# Patient Record
Sex: Female | Born: 1955 | Race: White | Hispanic: No | Smoking: Current every day smoker
Health system: Southern US, Community
[De-identification: ages and names within clinical notes are randomized; demographics above are authoritative.]

## PROBLEM LIST (undated history)

## (undated) ENCOUNTER — Emergency Department (HOSPITAL_COMMUNITY): Payer: Self-pay | Source: Home / Self Care

## (undated) DIAGNOSIS — I779 Disorder of arteries and arterioles, unspecified: Secondary | ICD-10-CM

## (undated) DIAGNOSIS — M5430 Sciatica, unspecified side: Secondary | ICD-10-CM

## (undated) DIAGNOSIS — E119 Type 2 diabetes mellitus without complications: Secondary | ICD-10-CM

## (undated) DIAGNOSIS — G8929 Other chronic pain: Secondary | ICD-10-CM

## (undated) DIAGNOSIS — IMO0002 Reserved for concepts with insufficient information to code with codable children: Secondary | ICD-10-CM

## (undated) DIAGNOSIS — M549 Dorsalgia, unspecified: Secondary | ICD-10-CM

## (undated) DIAGNOSIS — IMO0001 Reserved for inherently not codable concepts without codable children: Secondary | ICD-10-CM

## (undated) DIAGNOSIS — E7800 Pure hypercholesterolemia, unspecified: Secondary | ICD-10-CM

## (undated) HISTORY — PX: CHOLECYSTECTOMY: SHX55

## (undated) HISTORY — PX: TONSILLECTOMY: SUR1361

## (undated) HISTORY — PX: ECTOPIC PREGNANCY SURGERY: SHX613

## (undated) HISTORY — PX: ABDOMINAL HYSTERECTOMY: SHX81

---

## 2005-08-25 ENCOUNTER — Encounter: Payer: Self-pay | Admitting: Family Medicine

## 2005-09-07 ENCOUNTER — Encounter: Payer: Self-pay | Admitting: Family Medicine

## 2005-10-07 ENCOUNTER — Encounter: Payer: Self-pay | Admitting: Family Medicine

## 2009-11-27 ENCOUNTER — Emergency Department: Payer: Self-pay | Admitting: Emergency Medicine

## 2009-12-05 ENCOUNTER — Emergency Department: Payer: Self-pay | Admitting: Emergency Medicine

## 2014-01-25 ENCOUNTER — Encounter (HOSPITAL_COMMUNITY): Payer: Self-pay | Admitting: Emergency Medicine

## 2014-01-25 ENCOUNTER — Emergency Department (HOSPITAL_COMMUNITY)
Admission: EM | Admit: 2014-01-25 | Discharge: 2014-01-26 | Disposition: A | Discharge status: Home / Self Care | Payer: Self-pay | Attending: Emergency Medicine | Admitting: Emergency Medicine

## 2014-01-25 ENCOUNTER — Emergency Department (HOSPITAL_COMMUNITY): Payer: Self-pay

## 2014-01-25 DIAGNOSIS — M5430 Sciatica, unspecified side: Secondary | ICD-10-CM | POA: Insufficient documentation

## 2014-01-25 DIAGNOSIS — M5432 Sciatica, left side: Secondary | ICD-10-CM

## 2014-01-25 DIAGNOSIS — Z79899 Other long term (current) drug therapy: Secondary | ICD-10-CM | POA: Insufficient documentation

## 2014-01-25 DIAGNOSIS — F17200 Nicotine dependence, unspecified, uncomplicated: Secondary | ICD-10-CM | POA: Insufficient documentation

## 2014-01-25 DIAGNOSIS — Z90710 Acquired absence of both cervix and uterus: Secondary | ICD-10-CM | POA: Insufficient documentation

## 2014-01-25 DIAGNOSIS — G8929 Other chronic pain: Secondary | ICD-10-CM | POA: Insufficient documentation

## 2014-01-25 DIAGNOSIS — M25559 Pain in unspecified hip: Secondary | ICD-10-CM | POA: Insufficient documentation

## 2014-01-25 DIAGNOSIS — Z7982 Long term (current) use of aspirin: Secondary | ICD-10-CM | POA: Insufficient documentation

## 2014-01-25 HISTORY — DX: Other chronic pain: G89.29

## 2014-01-25 HISTORY — DX: Reserved for concepts with insufficient information to code with codable children: IMO0002

## 2014-01-25 HISTORY — DX: Dorsalgia, unspecified: M54.9

## 2014-01-25 MED ORDER — IBUPROFEN 800 MG PO TABS
800.0000 mg | ORAL_TABLET | Freq: Once | ORAL | Status: AC
  Administered 2014-01-25: 800 mg via ORAL
  Filled 2014-01-25: qty 1

## 2014-01-25 MED ORDER — HYDROCODONE-ACETAMINOPHEN 5-325 MG PO TABS
2.0000 | ORAL_TABLET | Freq: Once | ORAL | Status: AC
  Administered 2014-01-25: 2 via ORAL
  Filled 2014-01-25: qty 2

## 2014-01-25 NOTE — ED Provider Notes (Signed)
CSN: [id number]     Arrival date & time 01/25/14  2127 History  This chart was scribed for Glynn Octave, MD by Bronson Curb, ED Scribe. This patient was seen in room APA11/APA11 and the patient's care was started at 11:32 PM.     Chief Complaint  Patient presents with  . Hip Pain     The history is provided by the patient. No language interpreter was used.    HPI Comments: Brianna Edwards is a 59 y.o. female who presents to the Emergency Department complaining of left hip pain onset today. Patient states she has experienced this pain before, but has gotten worse since raking the yard this evening. She states the pain radiates from her back down to her left knee. There is associated left lower back pain and left knee pain. She denies CP, abdominal pain, bowel/bladder incontinence. Patient has history of chronic back pain and herniated disc.   Past Medical History  Diagnosis Date  . Chronic back pain   . Herniated disc    Past Surgical History  Procedure Laterality Date  . Cholecystectomy    . Abdominal hysterectomy    . Tonsillectomy     History reviewed. No pertinent family history. History  Substance Use Topics  . Smoking status: Current Every Day Smoker -- 1.00 packs/day  . Smokeless tobacco: Not on file  . Alcohol Use: No   OB History   Grav Para Term Preterm Abortions TAB SAB Ect Mult Living                 Review of Systems  A complete 10 system review of systems was obtained and all systems are negative except as noted in the HPI and PMH.      Allergies  Morphine and related  Home Medications   Prior to Admission medications   Medication Sig Start Date End Date Taking? Authorizing Provider  aspirin EC 325 MG tablet Take 650 mg by mouth every 6 (six) hours as needed.   Yes Historical Provider, MD  hydrochlorothiazide (HYDRODIURIL) 25 MG tablet Take 25 mg by mouth daily.   Yes Historical Provider, MD  pseudoephedrine-acetaminophen (TYLENOL SINUS)  30-500 MG TABS Take 2 tablets by mouth every 4 (four) hours as needed.   Yes Historical Provider, MD  HYDROcodone-acetaminophen (NORCO/VICODIN) 5-325 MG per tablet Take 2 tablets by mouth every 4 (four) hours as needed. 01/26/14   Glynn Octave, MD  ibuprofen (ADVIL,MOTRIN) 800 MG tablet Take 1 tablet (800 mg total) by mouth 3 (three) times daily. 01/26/14   Glynn Octave, MD  methylPREDNIsolone (MEDROL DOSPACK) 4 MG tablet follow package directions 01/26/14   Glynn Octave, MD   Triage Vitals: BP 153/84  Pulse 89  Temp(Src) 98.3 F (36.8 C) (Oral)  Resp 20  Ht 5\' 2"  (1.575 m)  Wt 207 lb (93.895 kg)  BMI 37.85 kg/m2  SpO2 99%  Physical Exam  Nursing note and vitals reviewed. Constitutional: She is oriented to person, place, and time. She appears well-developed and well-nourished. No distress.  HENT:  Head: Normocephalic and atraumatic.  Mouth/Throat: Oropharynx is clear and moist. No oropharyngeal exudate.  Eyes: Conjunctivae and EOM are normal. Pupils are equal, round, and reactive to light.  Neck: Normal range of motion. Neck supple.  No meningismus.  Cardiovascular: Normal rate, regular rhythm, normal heart sounds and intact distal pulses.   No murmur heard. Pulmonary/Chest: Effort normal and breath sounds normal. No respiratory distress.  Abdominal: Soft. There is no tenderness. There is  no rebound and no guarding.  Musculoskeletal: Normal range of motion. She exhibits no edema and no tenderness.  Tenderness to the lumbar spine without stepoff. FROM of left hip without pain. 5/5 strength in bilateral lower extremities. Ankle plantar and dorsiflexion intact. Great toe extension intact bilaterally. +2 DP and PT pulses. +2 patellar reflexes bilaterally. Normal gait.   Neurological: She is alert and oriented to person, place, and time. No cranial nerve deficit. She exhibits normal muscle tone. Coordination normal.  No ataxia on finger to nose bilaterally. No pronator drift. 5/5  strength throughout. CN 2-12 intact. Negative Romberg. Equal grip strength. Sensation intact. Gait is normal.   Skin: Skin is warm.  Psychiatric: She has a normal mood and affect. Her behavior is normal.    ED Course  Procedures (including critical care time)  DIAGNOSTIC STUDIES: Oxygen Saturation is 99% on room air, normal by my interpretation.    COORDINATION OF CARE: At 2335 Discussed treatment plan with patient which includes imaging. Patient agrees.   Labs Review Labs Reviewed - No data to display  Imaging Review Dg Lumbar Spine Complete  01/26/2014   CLINICAL DATA:  Back pain.  EXAM: LUMBAR SPINE - COMPLETE 4+ VIEW  COMPARISON:  None.  FINDINGS: Normal alignment of the lumbar vertebral bodies. Disc spaces and vertebral bodies are maintained. No acute bony findings or destructive bony changes. No pars defects. Moderate aortoiliac vascular calcifications without definite aneurysm. The visualized bony pelvis is intact. Sclerotic lesion noted in the right sacrum.  IMPRESSION: Normal alignment and no acute bony findings.   Electronically Signed   By: Loralie ChampagneMark  Gallerani M.D.   On: 01/26/2014 00:33   Dg Hip Complete Left  01/26/2014   CLINICAL DATA:  Left hip pain.  EXAM: LEFT HIP - COMPLETE 2+ VIEW  COMPARISON:  None.  FINDINGS: Both hips are normally located. No acute hip fracture. The pubic symphysis and SI joints are intact. No pelvic fractures.  IMPRESSION: No acute bony findings.   Electronically Signed   By: Loralie ChampagneMark  Gallerani M.D.   On: 01/26/2014 00:34     EKG Interpretation None      MDM   Final diagnoses:  Sciatica, left   Left-sided low back pain radiating down left leg after raking her yard today. History of herniated discs. No focal weakness, numbness or tingling. No bowel or bladder incontinence. No fever or vomiting.  Intact strength bilaterally. Intact reflexes. No evidence of cauda equina or cord compression. Xrays negative. No evidence of AAA.  Will treat for  sciatica with pain control, steroids, followup with PCP. Patient able to ambulate. Return precautions discussed.  BP 98/44  Pulse 74  Temp(Src) 98.3 F (36.8 C) (Oral)  Resp 20  Ht 5\' 2"  (1.575 m)  Wt 207 lb (93.895 kg)  BMI 37.85 kg/m2  SpO2 98%  I personally performed the services described in this documentation, which was scribed in my presence. The recorded information has been reviewed and is accurate.    Glynn OctaveStephen Savva Beamer, MD 01/26/14 [phone]

## 2014-01-25 NOTE — ED Notes (Signed)
Pt reporting pain in left hip and back starting this evening after raking the yard.  Pt reports history of herniated discs.

## 2014-01-26 ENCOUNTER — Emergency Department (HOSPITAL_COMMUNITY): Payer: Self-pay

## 2014-01-26 MED ORDER — HYDROCODONE-ACETAMINOPHEN 5-325 MG PO TABS: 2.0000 | ORAL_TABLET | ORAL | Status: DC | PRN

## 2014-01-26 MED ORDER — IBUPROFEN 800 MG PO TABS: 800.0000 mg | ORAL_TABLET | Freq: Three times a day (TID) | ORAL | Status: DC

## 2014-01-26 MED ORDER — METHYLPREDNISOLONE (PAK) 4 MG PO TABS: ORAL_TABLET | ORAL | Status: DC

## 2014-01-26 NOTE — Discharge Instructions (Signed)
Sciatica Sciatica is pain, weakness, numbness, or tingling along the path of the sciatic nerve. The nerve starts in the lower back and runs down the back of each leg. The nerve controls the muscles in the lower leg and in the back of the knee, while also providing sensation to the back of the thigh, lower leg, and the sole of your foot. Sciatica is a symptom of another medical condition. For instance, nerve damage or certain conditions, such as a herniated disk or bone spur on the spine, pinch or put pressure on the sciatic nerve. This causes the pain, weakness, or other sensations normally associated with sciatica. Generally, sciatica only affects one side of the body. CAUSES   Herniated or slipped disc.  Degenerative disk disease.  A pain disorder involving the narrow muscle in the buttocks (piriformis syndrome).  Pelvic injury or fracture.  Pregnancy.  Tumor (rare). SYMPTOMS  Symptoms can vary from mild to very severe. The symptoms usually travel from the low back to the buttocks and down the back of the leg. Symptoms can include:  Mild tingling or dull aches in the lower back, leg, or hip.  Numbness in the back of the calf or sole of the foot.  Burning sensations in the lower back, leg, or hip.  Sharp pains in the lower back, leg, or hip.  Leg weakness.  Severe back pain inhibiting movement. These symptoms may get worse with coughing, sneezing, laughing, or prolonged sitting or standing. Also, being overweight may worsen symptoms. DIAGNOSIS  Your caregiver will perform a physical exam to look for common symptoms of sciatica. He or she may ask you to do certain movements or activities that would trigger sciatic nerve pain. Other tests may be performed to find the cause of the sciatica. These may include:  Blood tests.  X-rays.  Imaging tests, such as an MRI or CT scan. TREATMENT  Treatment is directed at the cause of the sciatic pain. Sometimes, treatment is not necessary  and the pain and discomfort goes away on its own. If treatment is needed, your caregiver may suggest:  Over-the-counter medicines to relieve pain.  Prescription medicines, such as anti-inflammatory medicine, muscle relaxants, or narcotics.  Applying heat or ice to the painful area.  Steroid injections to lessen pain, irritation, and inflammation around the nerve.  Reducing activity during periods of pain.  Exercising and stretching to strengthen your abdomen and improve flexibility of your spine. Your caregiver may suggest losing weight if the extra weight makes the back pain worse.  Physical therapy.  Surgery to eliminate what is pressing or pinching the nerve, such as a bone spur or part of a herniated disk. HOME CARE INSTRUCTIONS   Only take over-the-counter or prescription medicines for pain or discomfort as directed by your caregiver.  Apply ice to the affected area for 20 minutes, 3-4 times a day for the first 48-72 hours. Then try heat in the same way.  Exercise, stretch, or perform your usual activities if these do not aggravate your pain.  Attend physical therapy sessions as directed by your caregiver.  Keep all follow-up appointments as directed by your caregiver.  Do not wear high heels or shoes that do not provide proper support.  Check your mattress to see if it is too soft. A firm mattress may lessen your pain and discomfort. SEEK IMMEDIATE MEDICAL CARE IF:   You lose control of your bowel or bladder (incontinence).  You have increasing weakness in the lower back, pelvis, buttocks,   or legs.  You have redness or swelling of your back.  You have a burning sensation when you urinate.  You have pain that gets worse when you lie down or awakens you at night.  Your pain is worse than you have experienced in the past.  Your pain is lasting longer than 4 weeks.  You are suddenly losing weight without reason. MAKE SURE YOU:  Understand these  instructions.  Will watch your condition.  Will get help right away if you are not doing well or get worse. Document Released: 05/20/2001 Document Revised: 11/25/2011 Document Reviewed: 10/05/2011 ExitCare Patient Information 2015 [employer]. This information is not intended to replace advice given to you by your health care provider. Make sure you discuss any questions you have with your health care provider.  

## 2014-08-18 ENCOUNTER — Ambulatory Visit: Payer: Self-pay

## 2014-12-22 ENCOUNTER — Other Ambulatory Visit (HOSPITAL_COMMUNITY): Payer: Self-pay | Admitting: Respiratory Therapy

## 2014-12-22 DIAGNOSIS — G47419 Narcolepsy without cataplexy: Secondary | ICD-10-CM

## 2014-12-22 DIAGNOSIS — R0683 Snoring: Secondary | ICD-10-CM

## 2014-12-22 DIAGNOSIS — G4700 Insomnia, unspecified: Secondary | ICD-10-CM

## 2014-12-22 DIAGNOSIS — G4710 Hypersomnia, unspecified: Secondary | ICD-10-CM

## 2015-01-26 ENCOUNTER — Ambulatory Visit: Payer: Medicaid Other | Attending: Family Medicine | Admitting: Sleep Medicine

## 2015-01-26 DIAGNOSIS — G4700 Insomnia, unspecified: Secondary | ICD-10-CM | POA: Insufficient documentation

## 2015-01-26 DIAGNOSIS — G47419 Narcolepsy without cataplexy: Secondary | ICD-10-CM | POA: Insufficient documentation

## 2015-01-26 DIAGNOSIS — G4710 Hypersomnia, unspecified: Secondary | ICD-10-CM | POA: Diagnosis not present

## 2015-01-26 DIAGNOSIS — R0683 Snoring: Secondary | ICD-10-CM | POA: Diagnosis not present

## 2015-02-02 ENCOUNTER — Other Ambulatory Visit (HOSPITAL_COMMUNITY): Payer: Self-pay | Admitting: Respiratory Therapy

## 2015-02-03 NOTE — Sleep Study (Signed)
  [facility] Tien Aispuro A. Gerilyn Pilgrim, MD     [email or web address]             NOCTURNAL POLYSOMNOGRAPHY   LOCATION: [facility]   Patient Name: Brianna Edwards, Brianna Edwards Date: 01/26/2015 Gender: Female D.O.B: Feb 10, 1956 Age (years): 24 Referring Provider: Cleatis Polka. Moore Height (inches): 62 Interpreting Physician: Beryle Beams MD, ABSM Weight (lbs): 207 RPSGT: Alfonso Ellis BMI: 38 MRN: [id number] Neck Size: 16.50 CLINICAL INFORMATION Sleep Study Type: NPSG  Indication for sleep study: Excessive Daytime Sleepiness, Snoring  Epworth Sleepiness Score: NA  SLEEP STUDY TECHNIQUE As per the AASM Manual for the Scoring of Sleep and Associated Events v2.3 (April 2016) with a hypopnea requiring 4% desaturations.  The channels recorded and monitored were frontal, central and occipital EEG, electrooculogram (EOG), submentalis EMG (chin), nasal and oral airflow, thoracic and abdominal wall motion, anterior tibialis EMG, snore microphone, electrocardiogram, and pulse oximetry.  MEDICATIONS Patient's medications include: N/A. Medications self-administered by patient during sleep study : No sleep medicine administered.  SLEEP ARCHITECTURE The study was initiated at 10:18:08 PM and ended at 4:49:21 AM.  Sleep onset time was 14.8 minutes and the sleep efficiency was 87.3%. The total sleep time was 341.4 minutes.  Stage REM latency was 101.0 minutes.  The patient spent 2.93% of the night in stage N1 sleep, 43.17% in stage N2 sleep, 35.74% in stage N3 and 18.16% in REM.  Alpha intrusion was absent.  Supine sleep was 0.00%.  RESPIRATORY PARAMETERS The overall apnea/hypopnea index (AHI) was 23.4 per hour. There were 30 total apneas, including 30 obstructive, 0 central and 0 mixed apneas. There were 103 hypopneas and 0 RERAs.  The AHI during Stage REM sleep was 40.6 per hour.  AHI while supine was N/A per hour.  The mean oxygen saturation was 88.99%. The minimum SpO2  during sleep was 72.00%.  Moderate snoring was noted during this study.  CARDIAC DATA The 2 lead EKG demonstrated sinus rhythm. The mean heart rate was N/A beats per minute. Other EKG findings include: None. LEG MOVEMENT DATA The total PLMS were 0 with a resulting PLMS index of 0.00. Associated arousal with leg movement index was 0.0 .  IMPRESSIONS Moderate obstructive sleep apnea. Formal CPAP titration study suggested.     Argie Ramming, MD Diplomate, American Board of Sleep Medicine.

## 2015-02-05 ENCOUNTER — Other Ambulatory Visit (HOSPITAL_COMMUNITY): Payer: Self-pay | Admitting: Respiratory Therapy

## 2015-06-10 DIAGNOSIS — I779 Disorder of arteries and arterioles, unspecified: Secondary | ICD-10-CM

## 2015-06-10 DIAGNOSIS — M5430 Sciatica, unspecified side: Secondary | ICD-10-CM

## 2015-06-10 HISTORY — DX: Disorder of arteries and arterioles, unspecified: I77.9

## 2015-06-10 HISTORY — DX: Sciatica, unspecified side: M54.30

## 2015-07-21 ENCOUNTER — Encounter: Payer: Self-pay | Admitting: Emergency Medicine

## 2015-07-21 ENCOUNTER — Emergency Department
Admission: EM | Admit: 2015-07-21 | Discharge: 2015-07-21 | Disposition: A | Discharge status: Home / Self Care | Payer: Medicaid Other | Attending: Emergency Medicine | Admitting: Emergency Medicine

## 2015-07-21 ENCOUNTER — Emergency Department: Payer: Medicaid Other

## 2015-07-21 DIAGNOSIS — Z79899 Other long term (current) drug therapy: Secondary | ICD-10-CM | POA: Insufficient documentation

## 2015-07-21 DIAGNOSIS — E119 Type 2 diabetes mellitus without complications: Secondary | ICD-10-CM | POA: Insufficient documentation

## 2015-07-21 DIAGNOSIS — M316 Other giant cell arteritis: Secondary | ICD-10-CM

## 2015-07-21 DIAGNOSIS — Z7982 Long term (current) use of aspirin: Secondary | ICD-10-CM | POA: Diagnosis not present

## 2015-07-21 DIAGNOSIS — F17200 Nicotine dependence, unspecified, uncomplicated: Secondary | ICD-10-CM | POA: Diagnosis not present

## 2015-07-21 DIAGNOSIS — R519 Headache, unspecified: Secondary | ICD-10-CM

## 2015-07-21 DIAGNOSIS — R51 Headache: Secondary | ICD-10-CM | POA: Diagnosis present

## 2015-07-21 HISTORY — DX: Pure hypercholesterolemia, unspecified: E78.00

## 2015-07-21 HISTORY — DX: Type 2 diabetes mellitus without complications: E11.9

## 2015-07-21 LAB — BASIC METABOLIC PANEL
ANION GAP: 6 (ref 5–15)
BUN: 13 mg/dL (ref 6–20)
CALCIUM: 9.3 mg/dL (ref 8.9–10.3)
CO2: 26 mmol/L (ref 22–32)
Chloride: 106 mmol/L (ref 101–111)
Creatinine, Ser: 1.08 mg/dL — ABNORMAL HIGH (ref 0.44–1.00)
GFR calc Af Amer: >60 mL/min (ref >60)
GFR, EST NON AFRICAN AMERICAN: 56 mL/min — AB (ref >60)
GLUCOSE: 150 mg/dL — AB (ref 65–99)
Potassium: 4.1 mmol/L (ref 3.5–5.1)
Sodium: 138 mmol/L (ref 135–145)

## 2015-07-21 LAB — CBC WITH DIFFERENTIAL/PLATELET
BASOS ABS: 0.1 10*3/uL (ref 0–0.1)
Basophils Relative: 1 %
EOS PCT: 4 %
Eosinophils Absolute: 0.4 10*3/uL (ref 0–0.7)
HEMATOCRIT: 40.9 % (ref 35.0–47.0)
Hemoglobin: 13.5 g/dL (ref 12.0–16.0)
LYMPHS PCT: 22 %
Lymphs Abs: 2.7 10*3/uL (ref 1.0–3.6)
MCH: 29.8 pg (ref 26.0–34.0)
MCHC: 32.9 g/dL (ref 32.0–36.0)
MCV: 90.4 fL (ref 80.0–100.0)
Monocytes Absolute: 1.1 10*3/uL — ABNORMAL HIGH (ref 0.2–0.9)
Monocytes Relative: 9 %
NEUTROS ABS: 8 10*3/uL — AB (ref 1.4–6.5)
Neutrophils Relative %: 64 %
PLATELETS: 266 10*3/uL (ref 150–440)
RBC: 4.52 MIL/uL (ref 3.80–5.20)
RDW: 13.4 % (ref 11.5–14.5)
WBC: 12.4 10*3/uL — AB (ref 3.6–11.0)

## 2015-07-21 LAB — SEDIMENTATION RATE: Sed Rate: 74 mm/hr — ABNORMAL HIGH (ref 0–30)

## 2015-07-21 MED ORDER — PREDNISONE 20 MG PO TABS: 60.0000 mg | ORAL_TABLET | Freq: Every day | ORAL | Status: AC

## 2015-07-21 MED ORDER — BUTALBITAL-APAP-CAFFEINE 50-325-40 MG PO TABS
2.0000 | ORAL_TABLET | Freq: Once | ORAL | Status: AC
  Administered 2015-07-21: 2 via ORAL
  Filled 2015-07-21: qty 2

## 2015-07-21 MED ORDER — OXYCODONE-ACETAMINOPHEN 5-325 MG PO TABS: 1.0000 | ORAL_TABLET | ORAL | Status: DC | PRN

## 2015-07-21 NOTE — ED Notes (Signed)
Headache L temple x 1 month. Denies injury at onset. Denies taking blood thinners. States saw MD and took prednisone pack. MD told her if not relieved to come to ED to get a head scan.

## 2015-07-21 NOTE — ED Notes (Signed)
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

## 2015-07-21 NOTE — ED Notes (Signed)
CT results received, pt to flex.

## 2015-07-21 NOTE — ED Provider Notes (Signed)
[facility] Emergency Department Provider Note ___________________________________________  Time seen: Approximately 12:27 PM  I have reviewed the triage vital signs and the nursing notes.   HISTORY  Chief Complaint Headache   HPI Brianna Edwards is a 60 y.o. female is here complaining of left-sided headache for one month. Patient states that she has seen her primary care doctor and was placed on prednisone and told that if this did not relieve her headache that she is to come to the emergency room and get a head scan. Patient states that headache is in the left temporal area and is nonradiating. She denies any visual changes. She is unaware of any fever or chills. There's been no nausea, vomiting, light sensitivity or dizziness. She states that her PCP did mention something like arthritis of the artery and that is why she took a 6 day taper of prednisone. She states that currently her scalp is so tender in that area that she has not been able to wash her hair secondary to pain. She rates her pain as an 8 out of 10.   Past Medical History  Diagnosis Date  . Chronic back pain   . Herniated disc   . Hypercholesterolemia   . Diabetes mellitus without complication (HCC)     There are no active problems to display for this patient.   Past Surgical History  Procedure Laterality Date  . Cholecystectomy    . Abdominal hysterectomy    . Tonsillectomy      Current Outpatient Rx  Name  Route  Sig  Dispense  Refill  . aspirin EC 325 MG tablet   Oral   Take 650 mg by mouth every 6 (six) hours as needed.         . hydrochlorothiazide (HYDRODIURIL) 25 MG tablet   Oral   Take 25 mg by mouth daily.         Marland Kitchen HYDROcodone-acetaminophen (NORCO/VICODIN) 5-325 MG per tablet   Oral   Take 2 tablets by mouth every 4 (four) hours as needed.   10 tablet   0   . ibuprofen (ADVIL,MOTRIN) 800 MG tablet   Oral   Take 1 tablet (800 mg total) by mouth 3 (three) times  daily.   21 tablet   0   . methylPREDNIsolone (MEDROL DOSPACK) 4 MG tablet      follow package directions   21 tablet   0   . oxyCODONE-acetaminophen (PERCOCET) 5-325 MG tablet   Oral   Take 1 tablet by mouth every 4 (four) hours as needed for severe pain.   30 tablet   0   . predniSONE (DELTASONE) 20 MG tablet   Oral   Take 3 tablets (60 mg total) by mouth daily.   30 tablet   0   . pseudoephedrine-acetaminophen (TYLENOL SINUS) 30-500 MG TABS   Oral   Take 2 tablets by mouth every 4 (four) hours as needed.           Allergies Morphine and related  No family history on file.  Social History Social History  Substance Use Topics  . Smoking status: Current Every Day Smoker -- 1.00 packs/day  . Smokeless tobacco: None  . Alcohol Use: No    Review of Systems Constitutional: No fever/chills Eyes: No visual changes. ENT: No sore throat. No nasal congestion. Cardiovascular: Denies chest pain. Respiratory: Denies shortness of breath. Gastrointestinal:  No nausea, no vomiting.  No diarrhea.   Genitourinary: Negative for dysuria. Musculoskeletal: Negative  for back pain. Skin: Tender right temporal area. Neurological: Positive for headaches, no focal weakness or numbness.  10-point ROS otherwise negative.  ____________________________________________   PHYSICAL EXAM:  VITAL SIGNS: ED Triage Vitals  Enc Vitals Group     BP 07/21/15 1145 145/79 mmHg     Pulse Rate 07/21/15 1145 88     Resp 07/21/15 1145 18     Temp 07/21/15 1145 97.9 F (36.6 C)     Temp Source 07/21/15 1145 Oral     SpO2 07/21/15 1145 97 %     Weight 07/21/15 1145 209 lb (94.802 kg)     Height 07/21/15 1145  (1.575 m)     Head Cir --      Peak Flow --      Pain Score 07/21/15 1146 8     Pain Loc --      Pain Edu? --      Excl. in GC? --     Constitutional: Alert and oriented. Well appearing and in no acute distress. Eyes: Conjunctivae are normal. PERRL. EOMI. Head:  Atraumatic. No abnormalities seen on the scalp on the right side. Marked tenderness on palpation of the right lateral scalp and temporal area. No edema is present. Nose: No congestion/rhinnorhea. Mouth/Throat: Mucous membranes are moist.  Oropharynx non-erythematous. Neck: No stridor. Supple  Hematological/Lymphatic/Immunilogical: No cervical lymphadenopathy. Cardiovascular: Normal rate, regular rhythm. Grossly normal heart sounds.  Good peripheral circulation. Respiratory: Normal respiratory effort.  No retractions. Lungs CTAB. Gastrointestinal: Soft and nontender. No distention.  Musculoskeletal: Moves upper and lower extremity is without any difficulty. Normal gait was noted. Neurologic:  Normal speech and language. No gross focal neurologic deficits are appreciated. No gait instability. Cranial nerves II through XII grossly intact. Reflexes are 2+ bilaterally. Skin:  Skin is warm, dry and intact. No rash noted. Psychiatric: Mood and affect are normal. Speech and behavior are normal.  ____________________________________________   LABS (all labs ordered are listed, but only abnormal results are displayed)  Labs Reviewed  CBC WITH DIFFERENTIAL/PLATELET - Abnormal; Notable for the following:    WBC 12.4 (*)    Neutro Abs 8.0 (*)    Monocytes Absolute 1.1 (*)    All other components within normal limits  BASIC METABOLIC PANEL - Abnormal; Notable for the following:    Glucose, Bld 150 (*)    Creatinine, Ser 1.08 (*)    GFR calc non Af Amer 56 (*)    All other components within normal limits  SEDIMENTATION RATE - Abnormal; Notable for the following:    Sed Rate 74 (*)    All other components within normal limits     RADIOLOGY  CT per radiologist was negative. ____________________________________________   PROCEDURES  Procedure(s) performed: None  Critical Care performed: No  ____________________________________________   INITIAL IMPRESSION / ASSESSMENT AND PLAN / ED  COURSE  Pertinent labs & imaging results that were available during my care of the patient were reviewed by me and considered in my medical decision making (see chart for details).  Patient's sedimentation rate was 74. It still remains very suspicious for temporal arteritis. Patient has not experienced any visual loss. She completed a 6 day taper course yesterday and is uncertain whether she experienced any improvement on the first day was 60 mg. We discussed that biopsy of the temporal artery. He needed for a definite diagnosis of temporal arteritis. Patient will contact her doctor for referral on Monday. Currently she was given a prescription for Percocet as needed  for headache and also started back on prednisone 60 mg daily for 7 days or until her PCP can see her in office the first of next week and adjust her dose depending on her symptom relief. ____________________________________________   FINAL CLINICAL IMPRESSION(S) / ED DIAGNOSES  Final diagnoses:  Temporal arteritis (HCC)  Right sided temporal headache      Tommi Rumps, PA-C 07/21/15 1617  Minna Antis, MD 07/22/15 2203

## 2015-07-21 NOTE — Discharge Instructions (Signed)
Contact your doctor at  [facility] on Monday morning. You need to have a biopsy of her temporal artery for confirmation of temporal arteritis Prednisone 60 mg daily for one week and then as instructed by your doctor. Extra pills were prescribed in your bottle. Do not stop this medication abruptly. Your doctor should inform you on how to take it the rest of the week. You most likely will need extra pills prescribed. Percocet as needed for headache. Return to the emergency room if any loss of vision.

## 2015-08-08 IMAGING — CT CT ABD-PELV W/O CM
2 of 4 series · 17 of 46 positions shown, 19 images · non-contrast
Comparison: None.

CLINICAL DATA: Low back and hip pain.

EXAM:
CT ABDOMEN AND PELVIS WITHOUT CONTRAST
TECHNIQUE: Multidetector CT imaging of the abdomen and pelvis was performed
following the standard protocol without IV contrast.

[Series 2: abdomen/pelvis w/o contrast · axial · non-contrast · 0.75mm/px · z∈[-468,-68]mm · 14 of 88 slices shown, 16 images]
[im 4/88  soft-tissue]
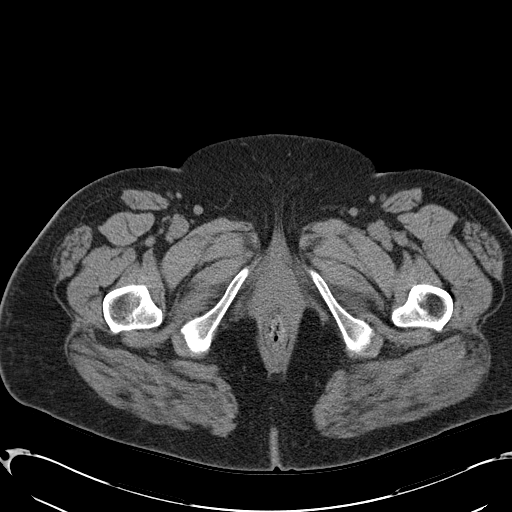
[im 4/88  bone]
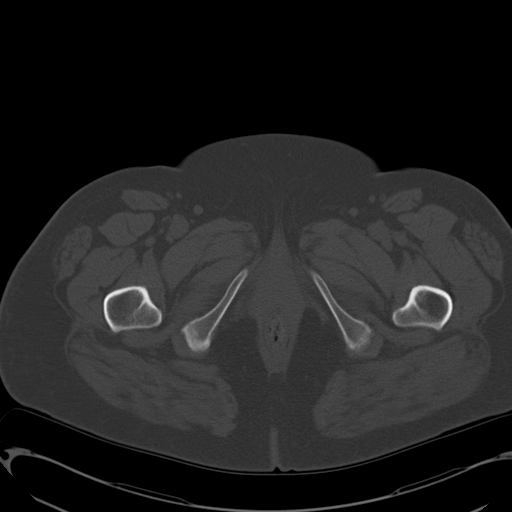
[im 11/88  soft-tissue]
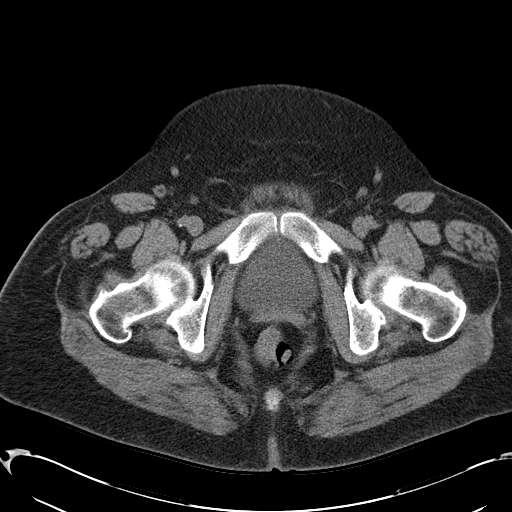
[im 17/88  soft-tissue]
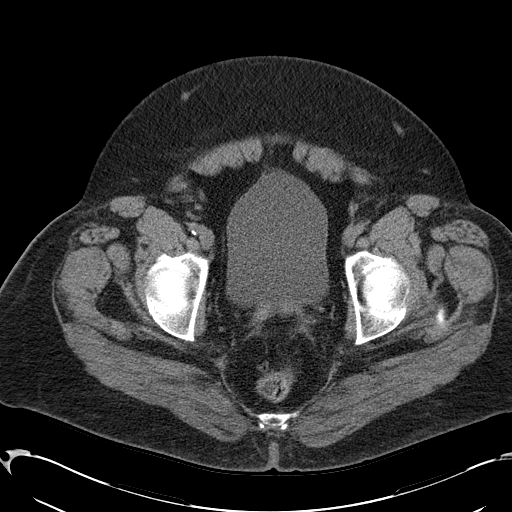
[im 24/88  soft-tissue]
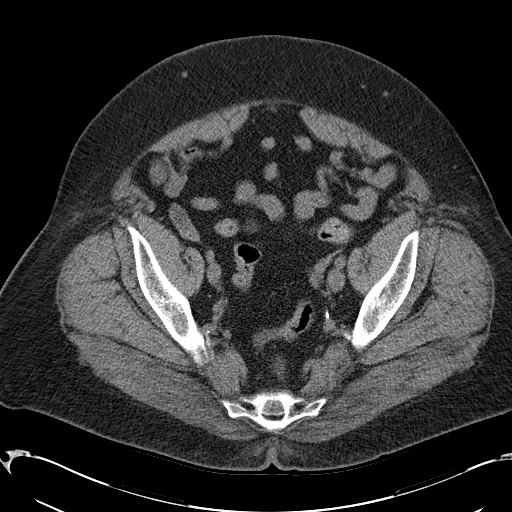
[im 31/88  soft-tissue]
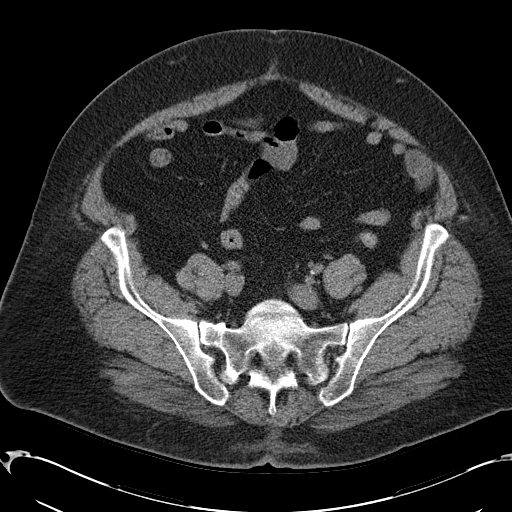
[im 34/88  soft-tissue]
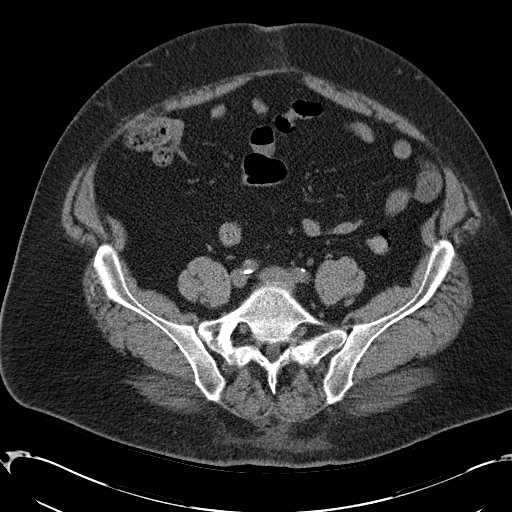
[im 41/88  soft-tissue]
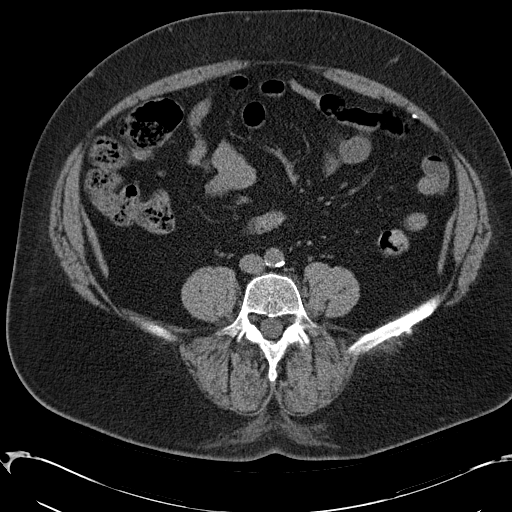
[im 47/88  soft-tissue]
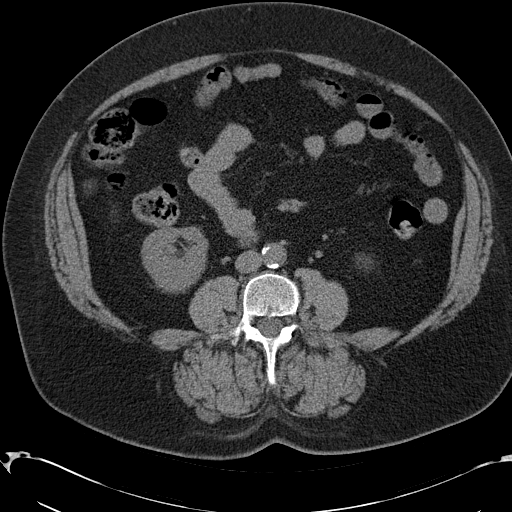
[im 54/88  soft-tissue]
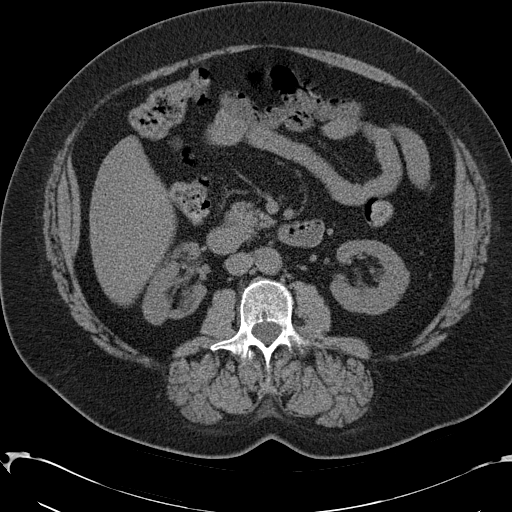
[im 54/88  bone]
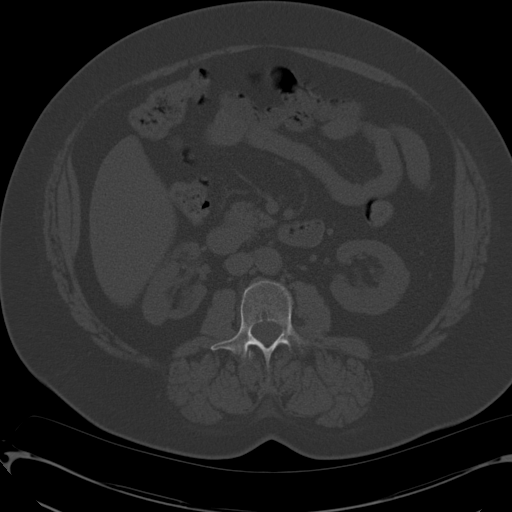
[im 57/88  soft-tissue]
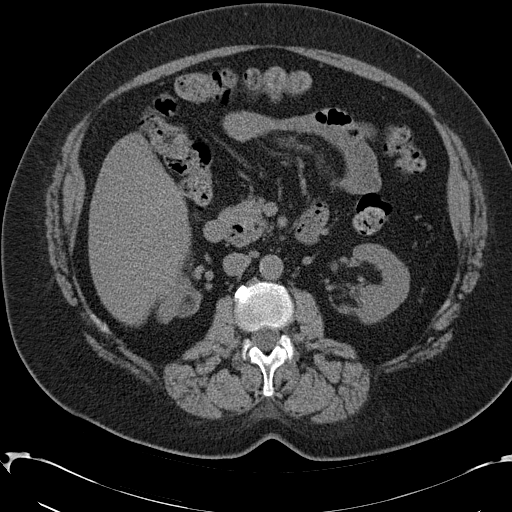
[im 64/88  soft-tissue]
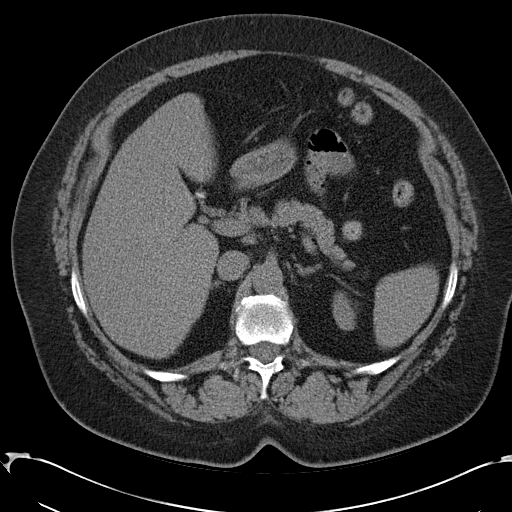
[im 71/88  soft-tissue]
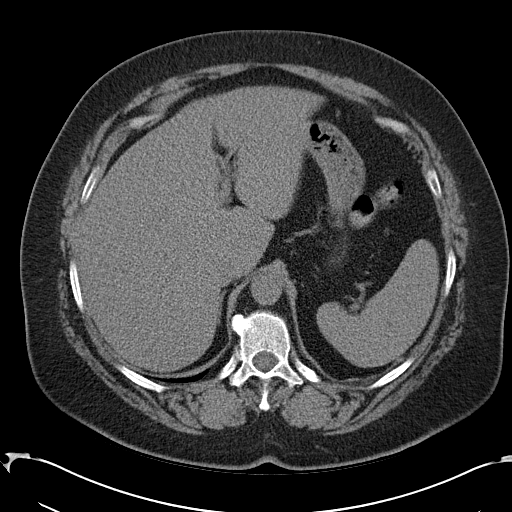
[im 77/88  soft-tissue]
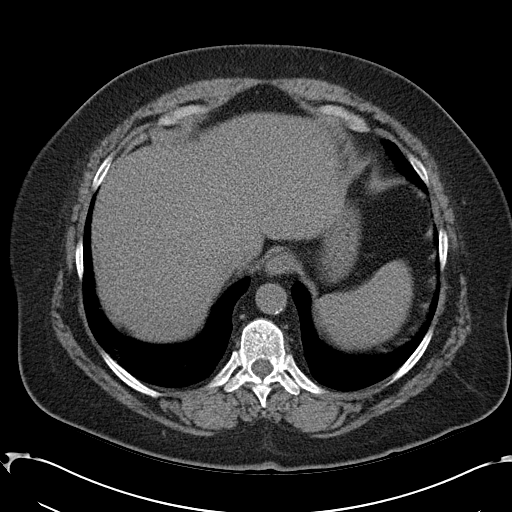
[im 84/88  soft-tissue]
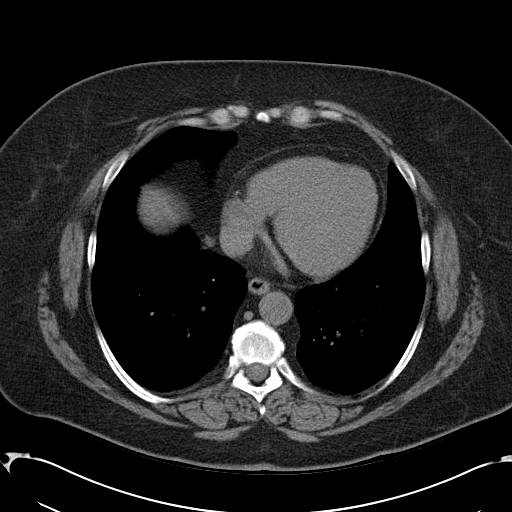

[Series 3: mpr cor (id) · coronal · 0.81mm/px · 3 of 113 slices shown]
[im 38/113  soft-tissue]
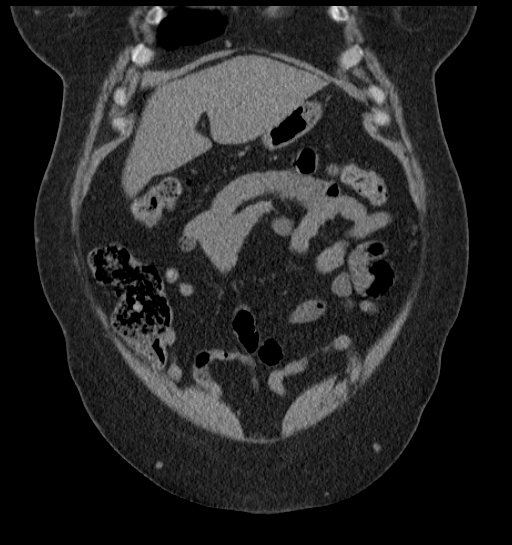
[im 50/113  soft-tissue]
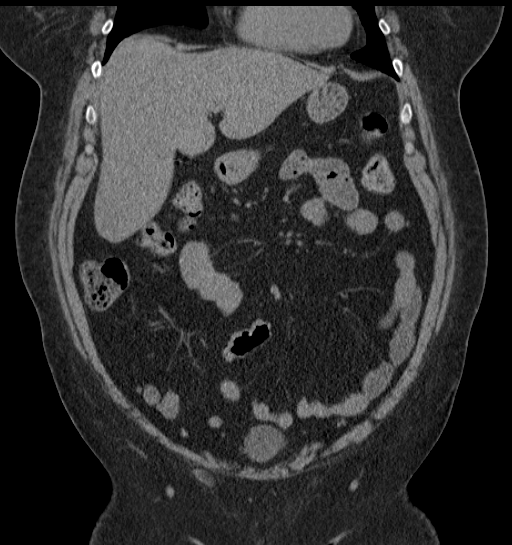
[im 63/113  soft-tissue]
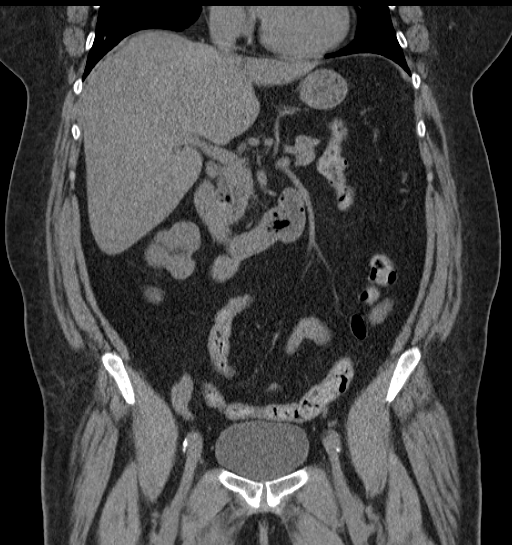

[17 of 46 positions shown; findings below may reference images not displayed]

FINDINGS: The lung bases are clear except for dependent atelectasis. The heart
is normal in size. No pericardial effusion. The esophagus is grossly
normal.

The liver is unremarkable without contrast. No focal lesions or
biliary dilatation. The gallbladder is surgically absent. No common
bowel duct dilatation. The pancreas is unremarkable. A small
duodenum diverticulum is noted in the pancreatic head. The spleen is
normal in size. No focal lesions. The adrenal glands normal. Both
kidneys demonstrates significant scarring changes but no renal or
obstructing ureteral calculi or mass.

The stomach, duodenum, small bowel and colon are unremarkable. No
inflammatory changes, mass lesions or obstructive findings. No
mesenteric or retroperitoneal mass or adenopathy. There are moderate
atherosclerotic calcifications involving the aorta but no aneurysm.
The branch vessels appear patent. Calcifications noted at the renal
artery ostia.

The uterus is surgically absent. The bladder is normal. No pelvic
mass or adenopathy. The left ovary is still present and appears
normal. No inguinal mass or adenopathy.

The bony structures are unremarkable.
IMPRESSION: No acute abdominal/ pelvic findings, mass lesions or adenopathy.

Atherosclerotic calcifications involving the aorta but no aneurysm.

Chronic scarring type changes involving both kidneys.

## 2015-08-17 ENCOUNTER — Other Ambulatory Visit: Payer: Self-pay | Admitting: Internal Medicine

## 2015-08-17 DIAGNOSIS — R519 Headache, unspecified: Secondary | ICD-10-CM

## 2015-08-17 DIAGNOSIS — I714 Abdominal aortic aneurysm, without rupture, unspecified: Secondary | ICD-10-CM

## 2015-08-17 DIAGNOSIS — R51 Headache: Principal | ICD-10-CM

## 2015-08-22 ENCOUNTER — Other Ambulatory Visit: Payer: Self-pay | Admitting: Vascular Surgery

## 2015-08-22 ENCOUNTER — Ambulatory Visit
Admission: RE | Admit: 2015-08-22 | Discharge: 2015-08-22 | Disposition: A | Discharge status: Home / Self Care | Payer: Medicaid Other | Source: Ambulatory Visit | Attending: Anesthesiology | Admitting: Anesthesiology

## 2015-08-22 ENCOUNTER — Encounter
Admission: RE | Admit: 2015-08-22 | Discharge: 2015-08-22 | Disposition: A | Discharge status: Home / Self Care | Payer: Medicaid Other | Source: Ambulatory Visit | Attending: Vascular Surgery | Admitting: Vascular Surgery

## 2015-08-22 DIAGNOSIS — R0602 Shortness of breath: Secondary | ICD-10-CM | POA: Insufficient documentation

## 2015-08-22 DIAGNOSIS — F17200 Nicotine dependence, unspecified, uncomplicated: Secondary | ICD-10-CM

## 2015-08-22 HISTORY — DX: Reserved for inherently not codable concepts without codable children: IMO0001

## 2015-08-22 LAB — CBC WITH DIFFERENTIAL/PLATELET
Basophils Absolute: 0.1 10*3/uL (ref 0–0.1)
Basophils Relative: 1 %
Eosinophils Absolute: 0.2 10*3/uL (ref 0–0.7)
Eosinophils Relative: 1 %
HEMATOCRIT: 47.4 % — AB (ref 35.0–47.0)
HEMOGLOBIN: 15.7 g/dL (ref 12.0–16.0)
LYMPHS ABS: 5.4 10*3/uL — AB (ref 1.0–3.6)
Lymphocytes Relative: 31 %
MCH: 29.6 pg (ref 26.0–34.0)
MCHC: 33.2 g/dL (ref 32.0–36.0)
MCV: 89.2 fL (ref 80.0–100.0)
MONO ABS: 1.5 10*3/uL — AB (ref 0.2–0.9)
MONOS PCT: 9 %
NEUTROS ABS: 10.5 10*3/uL — AB (ref 1.4–6.5)
NEUTROS PCT: 58 %
Platelets: 300 10*3/uL (ref 150–440)
RBC: 5.31 MIL/uL — ABNORMAL HIGH (ref 3.80–5.20)
RDW: 13.5 % (ref 11.5–14.5)
WBC: 17.8 10*3/uL — ABNORMAL HIGH (ref 3.6–11.0)

## 2015-08-22 LAB — BASIC METABOLIC PANEL
ANION GAP: 10 (ref 5–15)
BUN: 20 mg/dL (ref 6–20)
CHLORIDE: 99 mmol/L — AB (ref 101–111)
CO2: 27 mmol/L (ref 22–32)
Calcium: 9.7 mg/dL (ref 8.9–10.3)
Creatinine, Ser: 1.09 mg/dL — ABNORMAL HIGH (ref 0.44–1.00)
GFR calc non Af Amer: 55 mL/min — ABNORMAL LOW (ref >60)
GLUCOSE: 135 mg/dL — AB (ref 65–99)
Potassium: 3.3 mmol/L — ABNORMAL LOW (ref 3.5–5.1)
Sodium: 136 mmol/L (ref 135–145)

## 2015-08-22 LAB — TYPE AND SCREEN
ABO/RH(D): O NEG
Antibody Screen: NEGATIVE

## 2015-08-22 LAB — ABO/RH: ABO/RH(D): O NEG

## 2015-08-22 LAB — PROTIME-INR
INR: 0.83
Prothrombin Time: 11.6 seconds (ref 11.4–15.0)

## 2015-08-22 LAB — APTT: aPTT: 27 seconds (ref 24–36)

## 2015-08-22 NOTE — Pre-Procedure Instructions (Signed)
Faxed results of BMet and CBC to Dr Gilda CreaseSchnier , Vernona RiegerLaura at office notified.

## 2015-08-22 NOTE — Patient Instructions (Signed)
  Your procedure is scheduled on: Friday 08/24/15 Report to Day Surgery. 2ND FLOOR MEDICAL MALL ENTRANCE To find out your arrival time please call [phone] between 1PM - 3PM on Thursday 08/23/15.  Remember: Instructions that are not followed completely may result in serious medical risk, up to and including death, or upon the discretion of your surgeon and anesthesiologist your surgery may need to be rescheduled.    __X__ 1. Do not eat food or drink liquids after midnight. No gum chewing or hard candies.     __X__ 2. No Alcohol for 24 hours before or after surgery.   ____ 3. Bring all medications with you on the day of surgery if instructed.    __X__ 4. Notify your doctor if there is any change in your medical condition     (cold, fever, infections).     Do not wear jewelry, make-up, hairpins, clips or nail polish.  Do not wear lotions, powders, or perfumes. You may wear deodorant.  Do not shave 48 hours prior to surgery. Men may shave face and neck.  Do not bring valuables to the hospital.    [facility] is not responsible for any belongings or valuables.               Contacts, dentures or bridgework may not be worn into surgery.  Leave your suitcase in the car. After surgery it may be brought to your room.  For patients admitted to the hospital, discharge time is determined by your                treatment team.   Patients discharged the day of surgery will not be allowed to drive home.   Please read over the following fact sheets that you were given:   Surgical Site Infection Prevention   __X__ Take these medicines the morning of surgery with A SIP OF WATER:    1. PREDNISONE  2.   3.   4.  5.  6.  ____ Fleet Enema (as directed)   ____ Use CHG Soap as directed  ____ Use inhalers on the day of surgery  __X__ Stop metformin 2 days prior to surgery STOP TODAY   ____ Take 1/2 of usual insulin dose the night before surgery and none on the morning of surgery.   __X__  Stop Coumadin/Plavix/aspirin on ALREADY STOPPED  ____ Stop Anti-inflammatories on NO LONGER TAKES   ____ Stop supplements until after surgery.    ____ Bring C-Pap to the hospital.

## 2015-08-23 ENCOUNTER — Ambulatory Visit: Payer: Medicaid Other

## 2015-08-23 ENCOUNTER — Ambulatory Visit
Admission: RE | Admit: 2015-08-23 | Discharge: 2015-08-23 | Disposition: A | Discharge status: Home / Self Care | Payer: Medicaid Other | Source: Ambulatory Visit | Attending: Internal Medicine | Admitting: Internal Medicine

## 2015-08-24 ENCOUNTER — Encounter: Payer: Self-pay | Admitting: *Deleted

## 2015-08-24 ENCOUNTER — Encounter
Admission: RE | Disposition: A | Discharge status: Home / Self Care | Payer: Self-pay | Source: Ambulatory Visit | Attending: Vascular Surgery

## 2015-08-24 ENCOUNTER — Ambulatory Visit: Payer: Medicaid Other | Admitting: Anesthesiology

## 2015-08-24 ENCOUNTER — Ambulatory Visit
Admission: RE | Admit: 2015-08-24 | Discharge: 2015-08-24 | Disposition: A | Discharge status: Home / Self Care | Payer: Medicaid Other | Source: Ambulatory Visit | Attending: Vascular Surgery | Admitting: Vascular Surgery

## 2015-08-24 DIAGNOSIS — Z9049 Acquired absence of other specified parts of digestive tract: Secondary | ICD-10-CM | POA: Diagnosis not present

## 2015-08-24 DIAGNOSIS — R51 Headache: Secondary | ICD-10-CM | POA: Diagnosis not present

## 2015-08-24 DIAGNOSIS — J449 Chronic obstructive pulmonary disease, unspecified: Secondary | ICD-10-CM | POA: Diagnosis not present

## 2015-08-24 DIAGNOSIS — Z90710 Acquired absence of both cervix and uterus: Secondary | ICD-10-CM | POA: Diagnosis not present

## 2015-08-24 DIAGNOSIS — E7800 Pure hypercholesterolemia, unspecified: Secondary | ICD-10-CM | POA: Diagnosis not present

## 2015-08-24 DIAGNOSIS — Z79899 Other long term (current) drug therapy: Secondary | ICD-10-CM | POA: Diagnosis not present

## 2015-08-24 DIAGNOSIS — I10 Essential (primary) hypertension: Secondary | ICD-10-CM | POA: Diagnosis not present

## 2015-08-24 DIAGNOSIS — E119 Type 2 diabetes mellitus without complications: Secondary | ICD-10-CM | POA: Diagnosis not present

## 2015-08-24 DIAGNOSIS — Z6837 Body mass index (BMI) 37.0-37.9, adult: Secondary | ICD-10-CM | POA: Insufficient documentation

## 2015-08-24 DIAGNOSIS — F17200 Nicotine dependence, unspecified, uncomplicated: Secondary | ICD-10-CM | POA: Diagnosis not present

## 2015-08-24 HISTORY — DX: Sciatica, unspecified side: M54.30

## 2015-08-24 HISTORY — DX: Disorder of arteries and arterioles, unspecified: I77.9

## 2015-08-24 HISTORY — PX: ARTERY BIOPSY: SHX891

## 2015-08-24 LAB — POCT I-STAT 4, (NA,K, GLUC, HGB,HCT)
Glucose, Bld: 153 mg/dL — ABNORMAL HIGH (ref 65–99)
HEMATOCRIT: 45 % (ref 36.0–46.0)
HEMOGLOBIN: 15.3 g/dL — AB (ref 12.0–15.0)
Potassium: 4 mmol/L (ref 3.5–5.1)
SODIUM: 138 mmol/L (ref 135–145)

## 2015-08-24 LAB — GLUCOSE, CAPILLARY: Glucose-Capillary: 158 mg/dL — ABNORMAL HIGH (ref 65–99)

## 2015-08-24 SURGERY — BIOPSY TEMPORAL ARTERY
Anesthesia: Monitor Anesthesia Care | Laterality: Right | Wound class: Clean

## 2015-08-24 MED ORDER — ONDANSETRON HCL 4 MG/2ML IJ SOLN: 4.0000 mg | Freq: Once | INTRAMUSCULAR | Status: DC | PRN

## 2015-08-24 MED ORDER — LIDOCAINE-EPINEPHRINE 1 %-1:100000 IJ SOLN
INTRAMUSCULAR | Status: DC | PRN
  Administered 2015-08-24: 13 mL

## 2015-08-24 MED ORDER — CEFAZOLIN SODIUM-DEXTROSE 2-3 GM-% IV SOLR
2.0000 g | INTRAVENOUS | Status: AC
  Administered 2015-08-24: 2 g via INTRAVENOUS

## 2015-08-24 MED ORDER — BUPIVACAINE-EPINEPHRINE (PF) 0.5% -1:200000 IJ SOLN
INTRAMUSCULAR | Status: AC
  Filled 2015-08-24: qty 30

## 2015-08-24 MED ORDER — FAMOTIDINE 20 MG PO TABS
ORAL_TABLET | ORAL | Status: AC
  Administered 2015-08-24: 20 mg via ORAL
  Filled 2015-08-24: qty 1

## 2015-08-24 MED ORDER — CEFAZOLIN SODIUM-DEXTROSE 2-3 GM-% IV SOLR
INTRAVENOUS | Status: AC
  Administered 2015-08-24: 2 g via INTRAVENOUS
  Filled 2015-08-24: qty 50

## 2015-08-24 MED ORDER — FENTANYL CITRATE (PF) 100 MCG/2ML IJ SOLN
25.0000 ug | INTRAMUSCULAR | Status: DC | PRN
  Administered 2015-08-24 (×2): 25 ug via INTRAVENOUS

## 2015-08-24 MED ORDER — LIDOCAINE-EPINEPHRINE 1 %-1:100000 IJ SOLN
INTRAMUSCULAR | Status: AC
  Filled 2015-08-24: qty 1

## 2015-08-24 MED ORDER — ONDANSETRON HCL 4 MG/2ML IJ SOLN
INTRAMUSCULAR | Status: DC | PRN
  Administered 2015-08-24: 4 mg via INTRAVENOUS

## 2015-08-24 MED ORDER — PROPOFOL 500 MG/50ML IV EMUL
INTRAVENOUS | Status: DC | PRN
  Administered 2015-08-24: 150 ug/kg/min via INTRAVENOUS

## 2015-08-24 MED ORDER — TRAMADOL HCL 50 MG PO TABS: 50.0000 mg | ORAL_TABLET | Freq: Four times a day (QID) | ORAL | Status: AC | PRN

## 2015-08-24 MED ORDER — MIDAZOLAM HCL 2 MG/2ML IJ SOLN
INTRAMUSCULAR | Status: DC | PRN
  Administered 2015-08-24: 2 mg via INTRAVENOUS

## 2015-08-24 MED ORDER — FENTANYL CITRATE (PF) 100 MCG/2ML IJ SOLN
INTRAMUSCULAR | Status: AC
  Filled 2015-08-24: qty 2

## 2015-08-24 MED ORDER — FENTANYL CITRATE (PF) 100 MCG/2ML IJ SOLN
INTRAMUSCULAR | Status: DC | PRN
  Administered 2015-08-24 (×4): 25 ug via INTRAVENOUS

## 2015-08-24 MED ORDER — BUPIVACAINE HCL (PF) 0.5 % IJ SOLN
INTRAMUSCULAR | Status: DC | PRN
  Administered 2015-08-24: 13 mL

## 2015-08-24 MED ORDER — SODIUM CHLORIDE 0.9 % IV SOLN
INTRAVENOUS | Status: DC
  Administered 2015-08-24: 100 mL/h via INTRAVENOUS
  Administered 2015-08-24: 11:00:00 via INTRAVENOUS

## 2015-08-24 MED ORDER — FAMOTIDINE 20 MG PO TABS
20.0000 mg | ORAL_TABLET | Freq: Once | ORAL | Status: AC
  Administered 2015-08-24: 20 mg via ORAL

## 2015-08-24 MED ORDER — BUPIVACAINE HCL (PF) 0.5 % IJ SOLN
INTRAMUSCULAR | Status: AC
  Filled 2015-08-24: qty 30

## 2015-08-24 MED ORDER — SODIUM CHLORIDE 0.9 % IV SOLN: INTRAVENOUS | Status: DC

## 2015-08-24 SURGICAL SUPPLY — 38 items
BLADE CLIPPER SURG (BLADE) ×3 IMPLANT
BLADE SURG 15 STRL LF DISP TIS (BLADE) ×1 IMPLANT
BLADE SURG 15 STRL SS (BLADE) ×2
BLADE SURG SZ11 CARB STEEL (BLADE) ×3 IMPLANT
CNTNR SPEC 2.5X3XGRAD LEK (MISCELLANEOUS)
CONT SPEC 4OZ STER OR WHT (MISCELLANEOUS)
CONTAINER SPEC 2.5X3XGRAD LEK (MISCELLANEOUS) IMPLANT
COTTON BALL STRL MEDIUM (GAUZE/BANDAGES/DRESSINGS) ×3 IMPLANT
DRAPE LAPAROTOMY 77X122 PED (DRAPES) ×3 IMPLANT
DRESSING TELFA 4X3 1S ST N-ADH (GAUZE/BANDAGES/DRESSINGS) ×3 IMPLANT
ELECT CAUTERY BLADE 6.4 (BLADE) ×3 IMPLANT
ELECT REM PT RETURN 9FT ADLT (ELECTROSURGICAL) ×3
ELECTRODE REM PT RTRN 9FT ADLT (ELECTROSURGICAL) ×1 IMPLANT
GLOVE BIO SURGEON STRL SZ7 (GLOVE) ×18 IMPLANT
GOWN L4 XLG 20 PK N/S (GOWN DISPOSABLE) ×3 IMPLANT
GOWN STRL REUS W/ TWL LRG LVL3 (GOWN DISPOSABLE) ×2 IMPLANT
GOWN STRL REUS W/TWL LRG LVL3 (GOWN DISPOSABLE) ×4
KIT RM TURNOVER STRD PROC AR (KITS) ×3 IMPLANT
LABEL OR SOLS (LABEL) ×3 IMPLANT
LIQUID BAND (GAUZE/BANDAGES/DRESSINGS) ×3 IMPLANT
NEEDLE HYPO 25X1 1.5 SAFETY (NEEDLE) ×6 IMPLANT
NS IRRIG 500ML POUR BTL (IV SOLUTION) ×3 IMPLANT
PACK BASIN MINOR ARMC (MISCELLANEOUS) ×3 IMPLANT
SOL PREP PVP 2OZ (MISCELLANEOUS) ×3
SOLUTION PREP PVP 2OZ (MISCELLANEOUS) ×1 IMPLANT
SUCTION FRAZIER HANDLE 10FR (MISCELLANEOUS) ×2
SUCTION TUBE FRAZIER 10FR DISP (MISCELLANEOUS) ×1 IMPLANT
SUT MNCRL AB 4-0 PS2 18 (SUTURE) ×3 IMPLANT
SUT SILK 2 0 (SUTURE) ×2
SUT SILK 2-0 18XBRD TIE 12 (SUTURE) ×1 IMPLANT
SUT SILK 3 0 (SUTURE) ×2
SUT SILK 3-0 18XBRD TIE 12 (SUTURE) ×1 IMPLANT
SUT SILK 4 0 (SUTURE) ×2
SUT SILK 4-0 18XBRD TIE 12 (SUTURE) ×1 IMPLANT
SUT VIC AB 3-0 SH 27 (SUTURE) ×2
SUT VIC AB 3-0 SH 27X BRD (SUTURE) ×1 IMPLANT
SYR BULB IRRIG 60ML STRL (SYRINGE) ×3 IMPLANT
SYRINGE 10CC LL (SYRINGE) ×6 IMPLANT

## 2015-08-24 NOTE — Anesthesia Procedure Notes (Signed)
Date/Time: 08/24/2015 11:44 AM Performed by: Junious SilkNOLES, Orlinda Slomski Pre-anesthesia Checklist: Patient identified, Emergency Drugs available, Suction available, Patient being monitored and Timeout performed Oxygen Delivery Method: Simple face mask

## 2015-08-24 NOTE — H&P (Signed)
[facility] History & Physical Update  The patient was interviewed and re-examined.  The patient's previous History and Physical has been reviewed and is unchanged.  There is no change in the plan of care. We plan to proceed with the scheduled procedure.  Maxi Rodas, Latina CraverGregory G, MD  08/24/2015, 10:52 AM

## 2015-08-24 NOTE — Discharge Instructions (Signed)
AMBULATORY SURGERY  °DISCHARGE INSTRUCTIONS ° ° °1) The drugs that you were given will stay in your system until tomorrow so for the next 24 hours you should not: ° °A) Drive an automobile °B) Make any legal decisions °C) Drink any alcoholic beverage ° ° °2) You may resume regular meals tomorrow.  Today it is better to start with liquids and gradually work up to solid foods. ° °You may eat anything you prefer, but it is better to start with liquids, then soup and crackers, and gradually work up to solid foods. ° ° °3) Please notify your doctor immediately if you have any unusual bleeding, trouble breathing, redness and pain at the surgery site, drainage, fever, or pain not relieved by medication. ° ° ° °4) Additional Instructions: ° °May shower ° °Please contact your physician with any problems or Same Day Surgery at [phone], Monday through Friday 6 am to 4 pm, or [place] [facility] Main number at [phone]. °

## 2015-08-24 NOTE — Op Note (Signed)
    OPERATIVE NOTE   PROCEDURE: Right temporal artery biopsy  PRE-OPERATIVE DIAGNOSIS: Persistent headache  POST-OPERATIVE DIAGNOSIS: Same  SURGEON: Treyten Monestime, Latina CraverGregory Edwards  ASSISTANT(S): Ms. Raul DelKim Stegmayer  ANESTHESIA: MAC  ESTIMATED BLOOD LOSS: 10 cc  FINDING(S): Temporal artery  SPECIMEN(S):  Temporal artery  INDICATIONS:   Brianna Edwards is a 60 y.o. female who presents with persistent headache raising concerns for temporal arteritis she is therefore undergoing biopsy.  DESCRIPTION: After full informed written consent was obtained from the patient, the patient was brought back to the operating room and placed supine upon the operating table.  Prior to induction, the patient received IV antibiotics.   After obtaining adequate anesthesia, the patient was then prepped and draped in the standard fashion for a  right temporal artery biopsy.  1% lidocaine with epinephrine is treated into the skin overlying the temporal artery along the anterior margin of the right ear. A linear incision is then created the dissection is carried down to expose the fascia plain Marcaine quarter percent was then infiltrated subfascially. The fascia is then opened with Metzenbaums scissors and the temporal artery is exposed. It is dissected circumferentially proximally and ligated with a 4-0 silk tie. It is then dissected over a distance of approximately 3-1/2 cm and ligated distally with a 4-0 silk ties.  Wound is then irrigated specimen was passed off the field and sent to pathology. The 3-0 Vicryl is then used to reapproximate the subcutaneous tissues and a 4-0 Monocryl was used to close the skin. The Dermabond is then applied as a dressing.   The patient tolerated this procedure well.   COMPLICATIONS: None  CONDITION: Brianna Edwards   Brianna Edwards [facility]  Office: [phone]   08/24/2015, 12:02 PM

## 2015-08-24 NOTE — Anesthesia Postprocedure Evaluation (Signed)
Anesthesia Post Note  Patient: Brianna AbbottVickie Lynn Edwards  Procedure(s) Performed: Procedure(s) (LRB): BIOPSY TEMPORAL ARTERY (Right)  Patient location during evaluation: PACU Anesthesia Type: MAC Level of consciousness: awake Pain management: satisfactory to patient Vital Signs Assessment: post-procedure vital signs reviewed and stable Respiratory status: nonlabored ventilation Cardiovascular status: stable Anesthetic complications: no    Last Vitals:  Filed Vitals:   08/24/15 1024  BP: 130/85  Pulse: 5  Temp: 37 C  Resp: 18    Last Pain:  Filed Vitals:   08/24/15 1231  PainSc: 7                  VAN STAVEREN,Franchelle Foskett

## 2015-08-24 NOTE — Anesthesia Preprocedure Evaluation (Signed)
Anesthesia Evaluation  Patient identified by MRN, date of birth, ID band Patient awake    Reviewed: Allergy & Precautions, NPO status   Airway Mallampati: III       Dental  (+) Teeth Intact   Pulmonary shortness of breath and with exertion, COPD, Current Smoker,     + decreased breath sounds      Cardiovascular Exercise Tolerance: Good hypertension, Pt. on medications  Rhythm:Regular     Neuro/Psych    GI/Hepatic negative GI ROS, Neg liver ROS,   Endo/Other  diabetes, Type obesity  Renal/GU negative Renal ROS     Musculoskeletal   Abdominal (+) + obese,   Peds  Hematology   Anesthesia Other Findings   Reproductive/Obstetrics                             Anesthesia Physical Anesthesia Plan  ASA: III  Anesthesia Plan: MAC   Post-op Pain Management:    Induction: Intravenous  Airway Management Planned: Natural Airway and Nasal Cannula  Additional Equipment:   Intra-op Plan:   Post-operative Plan:   Informed Consent: I have reviewed the patients History and Physical, chart, labs and discussed the procedure including the risks, benefits and alternatives for the proposed anesthesia with the patient or authorized representative who has indicated his/her understanding and acceptance.     Plan Discussed with: CRNA  Anesthesia Plan Comments:         Anesthesia Quick Evaluation

## 2015-08-24 NOTE — OR Nursing (Signed)
Diabetic sandwich tray served.  Son notified to come pick her up.  Will give instructions to son at car.

## 2015-08-24 NOTE — Transfer of Care (Signed)
Immediate Anesthesia Transfer of Care Note  Patient: Brianna AbbottVickie Lynn Edwards  Procedure(s) Performed: Procedure(s): BIOPSY TEMPORAL ARTERY (Right)  Patient Location: PACU  Anesthesia Type:General  Level of Consciousness: awake and alert   Airway & Oxygen Therapy: Patient Spontanous Breathing and Patient connected to face mask oxygen  Post-op Assessment: Report given to RN and Post -op Vital signs reviewed and stable  Post vital signs: Reviewed and stable  Last Vitals:  Filed Vitals:   08/24/15 1024  BP: 130/85  Pulse: 5  Temp: 37 C  Resp: 18    Complications: No apparent anesthesia complications

## 2015-08-27 ENCOUNTER — Ambulatory Visit
Admission: RE | Admit: 2015-08-27 | Discharge: 2015-08-27 | Disposition: A | Discharge status: Home / Self Care | Payer: Medicaid Other | Source: Ambulatory Visit | Attending: Internal Medicine | Admitting: Internal Medicine

## 2015-08-27 DIAGNOSIS — I714 Abdominal aortic aneurysm, without rupture, unspecified: Secondary | ICD-10-CM

## 2015-08-27 DIAGNOSIS — R700 Elevated erythrocyte sedimentation rate: Secondary | ICD-10-CM | POA: Insufficient documentation

## 2015-08-27 LAB — SURGICAL PATHOLOGY

## 2015-09-11 ENCOUNTER — Emergency Department
Admission: EM | Admit: 2015-09-11 | Discharge: 2015-09-11 | Disposition: A | Discharge status: Home / Self Care | Payer: Medicaid Other | Attending: Emergency Medicine | Admitting: Emergency Medicine

## 2015-09-11 ENCOUNTER — Encounter: Payer: Self-pay | Admitting: *Deleted

## 2015-09-11 DIAGNOSIS — E0842 Diabetes mellitus due to underlying condition with diabetic polyneuropathy: Secondary | ICD-10-CM

## 2015-09-11 DIAGNOSIS — F17200 Nicotine dependence, unspecified, uncomplicated: Secondary | ICD-10-CM | POA: Diagnosis not present

## 2015-09-11 DIAGNOSIS — Z79899 Other long term (current) drug therapy: Secondary | ICD-10-CM | POA: Insufficient documentation

## 2015-09-11 DIAGNOSIS — E1142 Type 2 diabetes mellitus with diabetic polyneuropathy: Secondary | ICD-10-CM | POA: Diagnosis not present

## 2015-09-11 DIAGNOSIS — Z7982 Long term (current) use of aspirin: Secondary | ICD-10-CM | POA: Diagnosis not present

## 2015-09-11 LAB — CBC WITH DIFFERENTIAL/PLATELET
Basophils Absolute: 0.2 10*3/uL — ABNORMAL HIGH (ref 0–0.1)
Basophils Relative: 1 %
EOS ABS: 0.3 10*3/uL (ref 0–0.7)
EOS PCT: 1 %
HCT: 44.6 % (ref 35.0–47.0)
Hemoglobin: 14.9 g/dL (ref 12.0–16.0)
LYMPHS ABS: 4.9 10*3/uL — AB (ref 1.0–3.6)
LYMPHS PCT: 25 %
MCH: 30 pg (ref 26.0–34.0)
MCHC: 33.5 g/dL (ref 32.0–36.0)
MCV: 89.6 fL (ref 80.0–100.0)
MONO ABS: 1.5 10*3/uL — AB (ref 0.2–0.9)
MONOS PCT: 8 %
Neutro Abs: 13 10*3/uL — ABNORMAL HIGH (ref 1.4–6.5)
Neutrophils Relative %: 65 %
PLATELETS: 261 10*3/uL (ref 150–440)
RBC: 4.97 MIL/uL (ref 3.80–5.20)
RDW: 14.1 % (ref 11.5–14.5)
WBC: 19.9 10*3/uL — ABNORMAL HIGH (ref 3.6–11.0)

## 2015-09-11 LAB — COMPREHENSIVE METABOLIC PANEL
ALK PHOS: 115 U/L (ref 38–126)
ALT: 23 U/L (ref 14–54)
AST: 17 U/L (ref 15–41)
Albumin: 3.6 g/dL (ref 3.5–5.0)
Anion gap: 6 (ref 5–15)
BUN: 22 mg/dL — ABNORMAL HIGH (ref 6–20)
CALCIUM: 9.9 mg/dL (ref 8.9–10.3)
CO2: 29 mmol/L (ref 22–32)
CREATININE: 1 mg/dL (ref 0.44–1.00)
Chloride: 102 mmol/L (ref 101–111)
Glucose, Bld: 164 mg/dL — ABNORMAL HIGH (ref 65–99)
Potassium: 3.6 mmol/L (ref 3.5–5.1)
SODIUM: 137 mmol/L (ref 135–145)
TOTAL PROTEIN: 6.9 g/dL (ref 6.5–8.1)

## 2015-09-11 LAB — URINALYSIS COMPLETE WITH MICROSCOPIC (ARMC ONLY)
BACTERIA UA: NONE SEEN
BILIRUBIN URINE: NEGATIVE
GLUCOSE, UA: NEGATIVE mg/dL
Ketones, ur: NEGATIVE mg/dL
LEUKOCYTES UA: NEGATIVE
Nitrite: NEGATIVE
PH: 5 (ref 5.0–8.0)
Protein, ur: 30 mg/dL — AB
Specific Gravity, Urine: 1.014 (ref 1.005–1.030)

## 2015-09-11 LAB — SEDIMENTATION RATE: SED RATE: 10 mm/h (ref 0–30)

## 2015-09-11 MED ORDER — GABAPENTIN 300 MG PO CAPS: 300.0000 mg | ORAL_CAPSULE | Freq: Three times a day (TID) | ORAL | Status: AC

## 2015-09-11 MED ORDER — METHOCARBAMOL 500 MG PO TABS
1000.0000 mg | ORAL_TABLET | Freq: Once | ORAL | Status: AC
  Administered 2015-09-11: 1000 mg via ORAL
  Filled 2015-09-11: qty 2

## 2015-09-11 MED ORDER — METHOCARBAMOL 1000 MG/10ML IJ SOLN: 1000.0000 mg | Freq: Once | INTRAMUSCULAR | Status: DC

## 2015-09-11 MED ORDER — HYDROMORPHONE HCL 1 MG/ML IJ SOLN
1.0000 mg | Freq: Once | INTRAMUSCULAR | Status: AC
  Administered 2015-09-11: 1 mg via INTRAMUSCULAR
  Filled 2015-09-11: qty 1

## 2015-09-11 NOTE — ED Provider Notes (Signed)
[facility] Emergency Department Provider Note  ____________________________________________  Time seen: Approximately 1:53 PM  I have reviewed the triage vital signs and the nursing notes.   HISTORY  Chief Complaint No chief complaint on file.    HPI Brianna Edwards is a 60 y.o. female patient complaining of bilateral upper and lower pain that began yesterday. 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 Patient describes pain as "shooting". Patient states she took tramadol without any relief. Patient states she is a two-year type II diabetic. Patient rating the pain as a 10 over 10. No palliative measures taken for prior to arrival to the ED.   Past Medical History  Diagnosis Date  . Chronic back pain   . Herniated disc   . Hypercholesterolemia   . Diabetes mellitus without complication (HCC)   . Shortness of breath dyspnea   . Sciatica 2017  . 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   Past Surgical History  Procedure Laterality Date  . Cholecystectomy    . Abdominal hysterectomy    . Tonsillectomy    . Ectopic pregnancy surgery Right   . Artery biopsy Right 08/24/2015    Procedure: BIOPSY TEMPORAL ARTERY;  Surgeon: Renford Dills, MD;  Location: [facility] ORS;  Service: Vascular;  Laterality: Right;    Current Outpatient Rx  Name  Route  Sig  Dispense  Refill  . aspirin EC 325 MG tablet   Oral   Take 650 mg by mouth every 6 (six) hours as needed.         . Atorvastatin Calcium (LIPITOR PO)   Oral   Take by mouth daily before breakfast.          . cyclobenzaprine (FLEXERIL) 10 MG tablet   Oral   Take 10 mg by mouth at bedtime.         . gabapentin (NEURONTIN) 300 MG capsule  Oral   Take 1 capsule (300 mg total) by mouth 3 (three) times daily.   90 capsule   2   . hydrochlorothiazide (HYDRODIURIL) 25 MG tablet   Oral   Take 25 mg by mouth daily.         . metFORMIN (GLUCOPHAGE) 500 MG tablet   Oral   Take 1,000 mg by mouth at bedtime.         . predniSONE (DELTASONE) 20 MG tablet   Oral   Take 3 tablets (60 mg total) by mouth daily.   30 tablet   0   . traMADol (ULTRAM) 50 MG tablet   Oral   Take 50 mg by mouth at bedtime.         . traMADol (ULTRAM) 50 MG tablet   Oral   Take 1 tablet (50 mg total) by mouth every 6 (six) hours as needed.   40 tablet   0     Allergies Ibuprofen and Morphine and related  No family history on file.  Social History Social History  Substance Use Topics  . Smoking status: Current Every Day Smoker -- 1.00 packs/day for 47 years  . Smokeless tobacco: Never Used  . Alcohol Use: No    Review of Systems Constitutional: No  fever/chills Eyes: No visual changes. ENT: No sore throat. Cardiovascular: Denies chest pain. Respiratory: Denies shortness of breath. Gastrointestinal: No abdominal pain.  No nausea, no vomiting.  No diarrhea.  No constipation. Genitourinary: Negative for dysuria. Musculoskeletal: Negative for back pain. Skin: Negative for rash. Neurological: Negative for headaches, focal weakness or numbness.Patient complaining the shooting pain in bilateral upper and lower extremities. Endocrine:  Diabetic and hyperlipidemia. Hematological/Lymphatic: Allergic/Immunilogical: Morphine and ibuprofen 10-point ROS otherwise negative.  ____________________________________________   PHYSICAL EXAM:  VITAL SIGNS: ED Triage Vitals  Enc Vitals Group     BP 09/11/15 1349 141/81 mmHg     Pulse Rate 09/11/15 1349 91     Resp 09/11/15 1349 20     Temp 09/11/15 1349 98.8 F (37.1 C)     Temp Source 09/11/15 1349 Oral     SpO2 09/11/15 1349 98 %     Weight 09/11/15 1349 207 lb (93.895 kg)      Height 09/11/15 1349 5\' 2"  (1.575 m)     Head Cir --      Peak Flow --      Pain Score 09/11/15 1352 10     Pain Loc --      Pain Edu? --      Excl. in GC? --     Constitutional: Alert and oriented.Moderate distress and anxious Eyes: Conjunctivae are normal. PERRL. EOMI. Head: Atraumatic. Nose: No congestion/rhinnorhea. Mouth/Throat: Mucous membranes are moist.  Oropharynx non-erythematous. Neck: No stridor.  No cervical spine tenderness to palpation. Hematological/Lymphatic/Immunilogical: No cervical lymphadenopathy. Cardiovascular: Normal rate, regular rhythm. Grossly normal heart sounds.  Good peripheral circulation. Respiratory: Normal respiratory effort.  No retractions. Lungs CTAB. Gastrointestinal: Soft and nontender. No distention. No abdominal bruits. No CVA tenderness. Musculoskeletal: No lower extremity tenderness nor edema.  No joint effusions. Neurologic:  Normal speech and language. No gross focal neurologic deficits are appreciated. No gait instability. Skin:  Skin is warm, dry and intact. No rash noted. Psychiatric: Mood and affect are normal. Speech and behavior are normal.  ____________________________________________   LABS (all labs ordered are listed, but only abnormal results are displayed)  Labs Reviewed  COMPREHENSIVE METABOLIC PANEL - Abnormal; Notable for the following:    Glucose, Bld 164 (*)    BUN 22 (*)    Total Bilirubin <0.1 (*)    All other components within normal limits  CBC WITH DIFFERENTIAL/PLATELET - Abnormal; Notable for the following:    WBC 19.9 (*)    Neutro Abs 13.0 (*)    Lymphs Abs 4.9 (*)    Monocytes Absolute 1.5 (*)    Basophils Absolute 0.2 (*)    All other components within normal limits  URINALYSIS COMPLETEWITH MICROSCOPIC ([facility] ONLY) - Abnormal; Notable for the following:    Color, Urine YELLOW (*)    APPearance CLEAR (*)    Hgb urine dipstick 1+ (*)    Protein, ur 30 (*)    Squamous Epithelial / LPF 0-5 (*)    All  other components within normal limits  SEDIMENTATION RATE   ____________________________________________  EKG   ____________________________________________  RADIOLOGY   ____________________________________________   PROCEDURES  Procedure(s) performed: None  Critical Care performed: No  ____________________________________________   INITIAL IMPRESSION / ASSESSMENT AND PLAN / ED COURSE  Pertinent labs & imaging results that were available during my care of the patient were reviewed by me and considered in my medical decision making (see chart for details).  Onset of diabetic neuropathy. Discussed patient trend of elevated white  blood count which she says been followed by her family doctor. Patient will follow-up with family doctor tomorrow to discuss lab results today. Patient was started on Neurontin 300 mg 3 times a day and advised to notify her family doctor.  ____________________________________________   FINAL CLINICAL IMPRESSION(S) / ED DIAGNOSES  Final diagnoses:  Diabetic polyneuropathy associated with diabetes mellitus due to underlying condition [facility])      Joni Reining, PA-C 09/11/15 1507  Jene Every, MD 09/11/15 [phone]

## 2015-09-11 NOTE — ED Notes (Signed)
Patient c/o bilateral ankle pain and bilateral wrist pain that began yesterday approximately 4 hours after working in the yard. Patient reports constant dull pain with intermittent sharp, shooting pain that radiates up to the knee.

## 2015-11-16 ENCOUNTER — Telehealth: Payer: Self-pay | Admitting: Orthopaedic Surgery

## 2015-11-16 NOTE — Telephone Encounter (Signed)
Patient came to office regarding appointment; inquiring to see one of our doctors for problems including knee pain and hip pain.  States has been seen by a provider at [facility], [place].  Ph [phone]/ Fax [phone]. Discussed 2nd opinion protocol which requires patient to provide notes, films, and reports, for our doctors to review, prior to scheduling (if approved).  Patient signed a release of information form, which was faxed to provider noted. Aware appointment pending this information and Dr's review.

## 2015-11-18 ENCOUNTER — Emergency Department
Admission: EM | Admit: 2015-11-18 | Discharge: 2015-11-18 | Disposition: A | Discharge status: Home / Self Care | Payer: Medicaid Other | Attending: Emergency Medicine | Admitting: Emergency Medicine

## 2015-11-18 ENCOUNTER — Encounter: Payer: Self-pay | Admitting: Emergency Medicine

## 2015-11-18 ENCOUNTER — Emergency Department: Payer: Medicaid Other

## 2015-11-18 DIAGNOSIS — F17200 Nicotine dependence, unspecified, uncomplicated: Secondary | ICD-10-CM | POA: Insufficient documentation

## 2015-11-18 DIAGNOSIS — M25552 Pain in left hip: Secondary | ICD-10-CM | POA: Diagnosis present

## 2015-11-18 DIAGNOSIS — E119 Type 2 diabetes mellitus without complications: Secondary | ICD-10-CM | POA: Diagnosis not present

## 2015-11-18 DIAGNOSIS — Z7952 Long term (current) use of systemic steroids: Secondary | ICD-10-CM | POA: Insufficient documentation

## 2015-11-18 DIAGNOSIS — Z8679 Personal history of other diseases of the circulatory system: Secondary | ICD-10-CM | POA: Insufficient documentation

## 2015-11-18 DIAGNOSIS — Z7984 Long term (current) use of oral hypoglycemic drugs: Secondary | ICD-10-CM | POA: Insufficient documentation

## 2015-11-18 DIAGNOSIS — Z79899 Other long term (current) drug therapy: Secondary | ICD-10-CM | POA: Diagnosis not present

## 2015-11-18 DIAGNOSIS — M5432 Sciatica, left side: Secondary | ICD-10-CM | POA: Diagnosis not present

## 2015-11-18 DIAGNOSIS — Z7982 Long term (current) use of aspirin: Secondary | ICD-10-CM | POA: Insufficient documentation

## 2015-11-18 MED ORDER — OXYCODONE-ACETAMINOPHEN 5-325 MG PO TABS: 1.0000 | ORAL_TABLET | Freq: Four times a day (QID) | ORAL | Status: AC | PRN

## 2015-11-18 MED ORDER — PREDNISONE 10 MG PO TABS: 10.0000 mg | ORAL_TABLET | Freq: Every day | ORAL | Status: AC

## 2015-11-18 MED ORDER — OXYCODONE-ACETAMINOPHEN 5-325 MG PO TABS
1.0000 | ORAL_TABLET | Freq: Once | ORAL | Status: AC
  Administered 2015-11-18: 1 via ORAL
  Filled 2015-11-18: qty 1

## 2015-11-18 NOTE — Discharge Instructions (Signed)

## 2015-11-18 NOTE — ED Notes (Signed)
Patient reports that her left hip has been "pooping out of joint" for a year. Patient reports that it has happened 3 times in the last day.

## 2015-11-18 NOTE — ED Provider Notes (Signed)
CSN: [id number]     Arrival date & time 11/18/15  2137 History   First MD Initiated Contact with Patient 11/18/15 2245     Chief Complaint  Patient presents with  . Hip Pain     (Consider location/radiation/quality/duration/timing/severity/associated sxs/prior Treatment) HPI  60 year old female reports to the emergency department for evaluation of left hip pain. Patient describes left hip pain as numbness tingling and burning radiating down the left posterior lateral thigh into the calf and foot. Her pain is increased with weightbearing activities. She does have some groin pain. Patient states she fell 2 weeks ago, sprained her left ankle and ended up sitting on the side of the tongue as she twisted her ankle. No significant trauma to the back or hip. One week ago, patient developed left hip pain which she describes as shooting pain with numbness and tingling down her left leg. She has a history of sciatica. She denies any weakness or loss of bowel or bladder symptoms. She is ambulatory. Pain is 8 out of 10. She is currently taking gabapentin with no improvement.  Past Medical History  Diagnosis Date  . Chronic back pain   . Herniated disc   . Hypercholesterolemia   . Diabetes mellitus without complication (HCC)   . Shortness of breath dyspnea   . Sciatica 2017  . 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 Cholecystectomy    . Abdominal hysterectomy    . Tonsillectomy    . Ectopic pregnancy surgery Right   . Artery biopsy Right 08/24/2015    Procedure: BIOPSY TEMPORAL ARTERY;  Surgeon: Renford DillsGregory G Schnier, MD;  Location: [facility] ORS;  Service: Vascular;  Laterality: Right;   No family history on file. Social History  Substance Use Topics  . Smoking status: Current Every Day Smoker -- 1.00 packs/day for 47 years  . Smokeless tobacco: Never Used  . Alcohol Use: No   OB History    No data  available     Review of Systems  Constitutional: Negative for fever, chills, activity change and fatigue.  HENT: Negative for congestion, sinus pressure and sore throat.   Eyes: Negative for visual disturbance.  Respiratory: Negative for cough, chest tightness and shortness of breath.   Cardiovascular: Negative for chest pain and leg swelling.  Gastrointestinal: Negative for nausea, vomiting, abdominal pain and diarrhea.  Genitourinary: Negative for dysuria.  Musculoskeletal: Positive for arthralgias. Negative for gait problem.  Skin: Negative for rash.  Neurological: Positive for numbness. Negative for weakness and headaches.  Hematological: Negative for adenopathy.  Psychiatric/Behavioral: Negative for behavioral problems, confusion and agitation.      Allergies  Ibuprofen and Morphine and related  Home Medications   Prior to Admission medications   Medication Sig Start Date End Date Taking? Authorizing Provider  aspirin EC 325 MG tablet Take 650 mg by mouth every 6 (six) hours as needed.    Historical Provider, MD  Atorvastatin Calcium (LIPITOR PO) Take by mouth daily before breakfast.     Historical Provider, MD  cyclobenzaprine (FLEXERIL) 10 MG tablet Take 10 mg by mouth at bedtime.    Historical Provider, MD  gabapentin (NEURONTIN) 300 MG capsule Take 1 capsule (300 mg total) by mouth 3 (three) times daily. 09/11/15 09/10/16  Joni Reiningonald K Smith, PA-C  hydrochlorothiazide (HYDRODIURIL) 25 MG tablet Take 25 mg by mouth daily.    Historical Provider, MD  metFORMIN (GLUCOPHAGE)  500 MG tablet Take 1,000 mg by mouth at bedtime.    Historical Provider, MD  oxyCODONE-acetaminophen (ROXICET) 5-325 MG tablet Take 1 tablet by mouth every 6 (six) hours as needed. 11/18/15 11/17/16  Evon Slack, PA-C  predniSONE (DELTASONE) 10 MG tablet Take 1 tablet (10 mg total) by mouth daily. 6,5,4,3,2,1 six day taper 11/18/15   Evon Slack, PA-C  predniSONE (DELTASONE) 20 MG tablet Take 3 tablets (60 mg  total) by mouth daily. 07/21/15 07/20/16  Tommi Rumps, PA-C  traMADol (ULTRAM) 50 MG tablet Take 50 mg by mouth at bedtime.    Historical Provider, MD  traMADol (ULTRAM) 50 MG tablet Take 1 tablet (50 mg total) by mouth every 6 (six) hours as needed. 08/24/15   Renford Dills, MD   BP 125/73 mmHg  Pulse 108  Temp(Src) 98.3 F (36.8 C) (Oral)  Resp 20  Ht  (1.549 m)  Wt 92.08 kg  BMI 38.38 kg/m2  SpO2 95% Physical Exam  Constitutional: She is oriented to person, place, and time. She appears well-developed and well-nourished. No distress.  HENT:  Head: Normocephalic and atraumatic.  Mouth/Throat: Oropharynx is clear and moist.  Eyes: EOM are normal. Pupils are equal, round, and reactive to light. Right eye exhibits no discharge. Left eye exhibits no discharge.  Neck: Normal range of motion. Neck supple.  Cardiovascular: Normal rate, regular rhythm and intact distal pulses.   Pulmonary/Chest: Effort normal and breath sounds normal. No respiratory distress. She exhibits no tenderness.  Abdominal: Soft. She exhibits no distension. There is no tenderness.  Musculoskeletal:  Lumbar Spine: Examination of the lumbar spine reveals no bony abnormality, no edema, and no ecchymosis.  There is no step off.  The patient has full range of motion of the lumbar spine with flexion and extension.  The patient has normal lateral bend and rotation.  The patient has no pain with range of motion activities.  The patient has a negative axial load test, and a negative rotational Waddell test.  The patient is non tender along the spinous process.  The patient is non tender along the paravertebral muscles, with no muscle spasms.  The patient is non tender along the iliac crest.  The patient is  tender in the left sciatic notch.  The patient is non tender along the Sacroiliac joint.  There is no Coccyx joint tenderness.    Bilateral Lower Extremities: Examination of the lower extremities reveals no bony  abnormality, no edema, and no ecchymosis.  The patient has full active and passive range of motion of the hips, knees, and ankles.  There is mild discomfort with left hip internal rotation.  The patient is non tender along the greater trochanter region.  The patient has a negative Denna Haggard' test bilaterally.  There is normal skin warmth.  There is normal capillary refill bilaterally.    Neurologic: The patient has a negative straight leg raise.  The patient has normal muscle strength testing for the quadriceps, calves, ankle dorsiflexion, ankle plantarflexion, and extensor hallicus longus.  The patient has sensation that is intact to light touch.  The deep tendon reflexes are normal at the patella.   Neurological: She is alert and oriented to person, place, and time. She has normal reflexes.  Skin: Skin is warm and dry.  Psychiatric: She has a normal mood and affect. Her behavior is normal. Thought content normal.    ED Course  Procedures (including critical care time) Labs Review Labs Reviewed - No  data to display  Imaging Review Dg Hip Unilat With Pelvis 2-3 Views Left  11/18/2015  CLINICAL DATA:  Acute onset of groin pain and left hip pain. Initial encounter. EXAM: DG HIP (WITH OR WITHOUT PELVIS) 2-3V LEFT COMPARISON:  None. FINDINGS: There is no evidence of fracture or dislocation. Both femoral heads are seated normally within their respective acetabula. The proximal left femur appears intact. No significant degenerative change is appreciated. The sacroiliac joints are unremarkable in appearance. The visualized bowel gas pattern is grossly unremarkable in appearance. Clips are seen overlying the left hemipelvis. IMPRESSION: No evidence of fracture or dislocation. Electronically Signed   By: Roanna Raider M.D.   On: 11/18/2015 22:21   I have personally reviewed and evaluated these images and lab results as part of my medical decision-making.   EKG Interpretation None      MDM   Final  diagnoses:  Sciatica, left    60 year old female with left hip pain. She describes her symptoms as pain numbness and tingling going down the left leg. Left hip x-ray is negative. She is ambulatory with no assisted devices. No neurological deficits on exam. She will continue gabapentin for pain. She is given a short supply of Percocet 5-3 25 for severe pain. Quantity #15, one tab by mouth every 6 hours. She will also start a 6 day steroid taper tomorrow. Follow-up with orthopedics if no improvement. She is educated on signs and symptoms to return to the ED for.    Evon Slack, PA-C 11/18/15 2259  Jene Every, MD 11/18/15 [id number]

## 2015-11-18 NOTE — ED Notes (Signed)
Patient left for xray.

## 2015-12-05 ENCOUNTER — Encounter: Payer: Self-pay | Admitting: Orthopaedic Surgery

## 2016-09-08 ENCOUNTER — Ambulatory Visit (INDEPENDENT_AMBULATORY_CARE_PROVIDER_SITE_OTHER): Payer: Self-pay | Admitting: Vascular Surgery

## 2016-09-08 ENCOUNTER — Other Ambulatory Visit (INDEPENDENT_AMBULATORY_CARE_PROVIDER_SITE_OTHER): Payer: Self-pay

## 2016-09-16 ENCOUNTER — Other Ambulatory Visit (INDEPENDENT_AMBULATORY_CARE_PROVIDER_SITE_OTHER): Payer: Self-pay

## 2016-09-16 ENCOUNTER — Ambulatory Visit (INDEPENDENT_AMBULATORY_CARE_PROVIDER_SITE_OTHER): Payer: Self-pay | Admitting: Vascular Surgery

## 2016-09-18 ENCOUNTER — Ambulatory Visit (INDEPENDENT_AMBULATORY_CARE_PROVIDER_SITE_OTHER): Payer: Medicaid Other | Admitting: Vascular Surgery

## 2016-10-06 ENCOUNTER — Ambulatory Visit (INDEPENDENT_AMBULATORY_CARE_PROVIDER_SITE_OTHER): Payer: Medicaid Other | Admitting: Vascular Surgery

## 2016-10-16 ENCOUNTER — Other Ambulatory Visit (INDEPENDENT_AMBULATORY_CARE_PROVIDER_SITE_OTHER): Payer: Self-pay

## 2016-10-16 ENCOUNTER — Ambulatory Visit (INDEPENDENT_AMBULATORY_CARE_PROVIDER_SITE_OTHER): Payer: Self-pay | Admitting: Vascular Surgery

## 2016-12-02 IMAGING — US US RETROPERITONEAL COMPLETE
1 series · 13 of 25 positions shown · non-contrast
Comparison: 01/26/2014

CLINICAL DATA: Abdominal aortic aneurysm

EXAM:
RETROPERITONEAL ULTRASOUND COMPLETE
TECHNIQUE: Ultrasound examination of the abdominal aorta was performed to
evaluate for abdominal aortic aneurysm. The common iliac arteries,
IVC, and kidneys were also evaluated.

[Series 1: us retroperitoneal complete · 0.31mm/px · 13 of 62 slices shown]
[im 1/62]
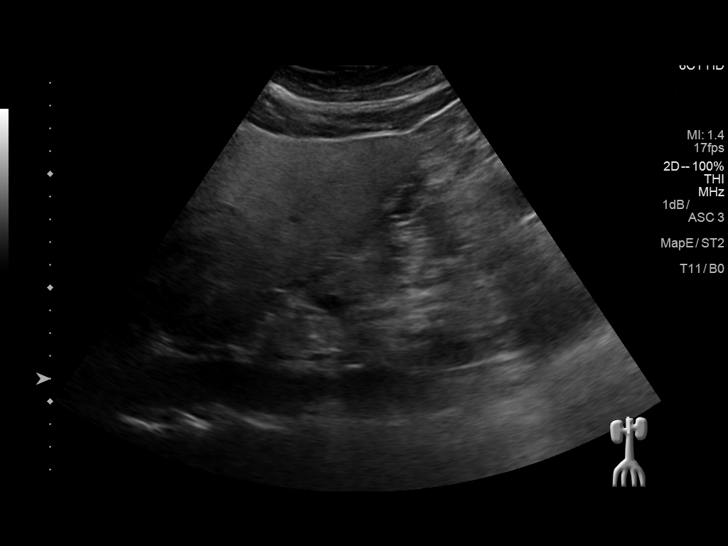
[im 6/62]
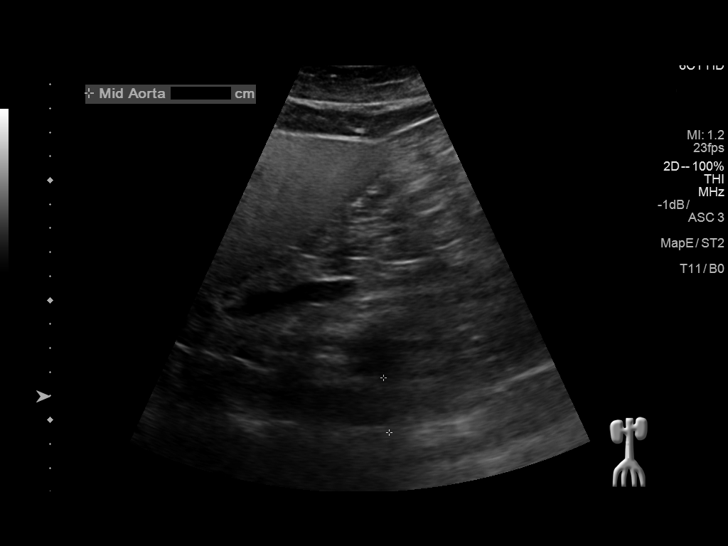
[im 11/62]
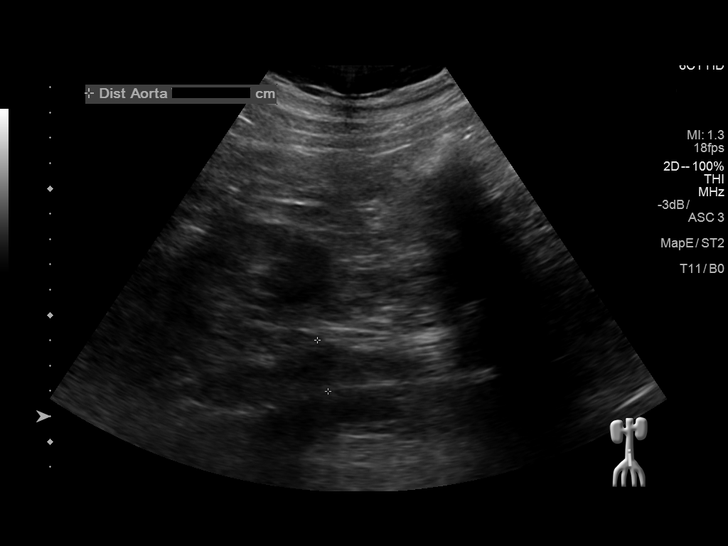
[im 16/62]
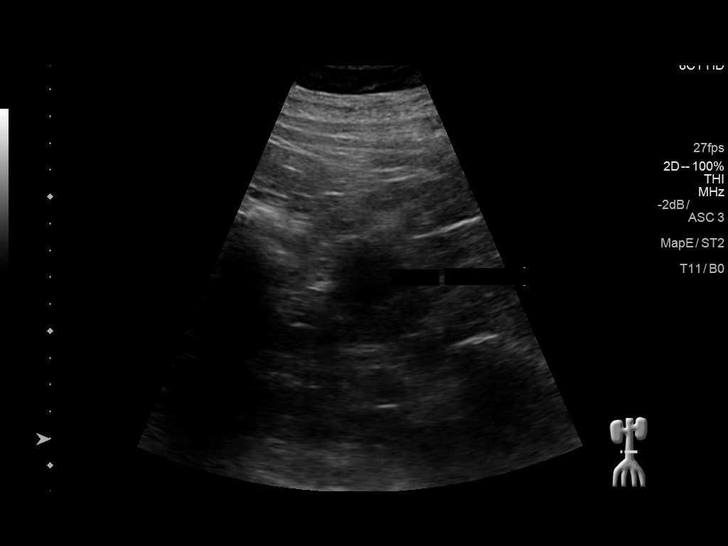
[im 21/62]
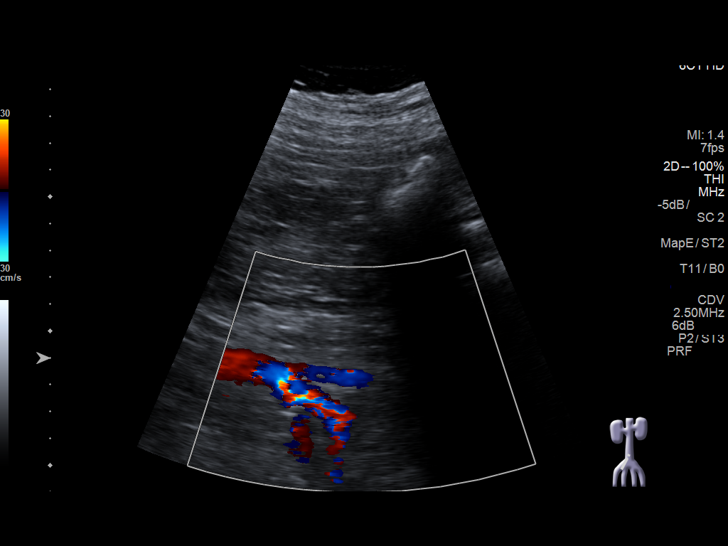
[im 26/62]
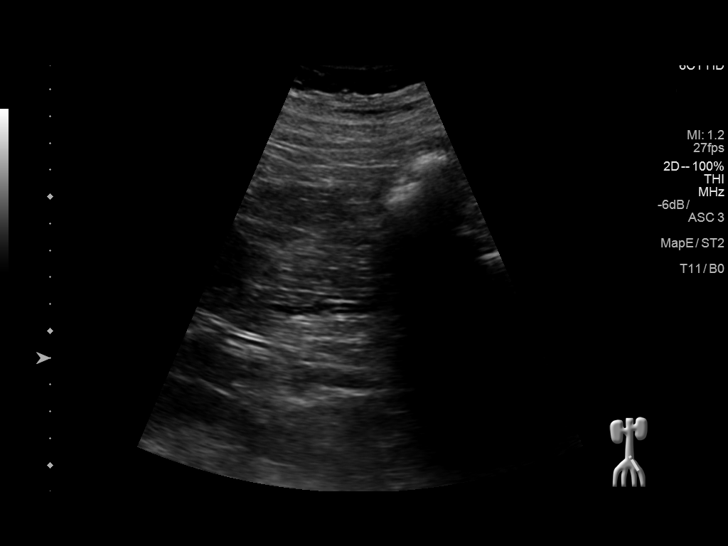
[im 31/62]
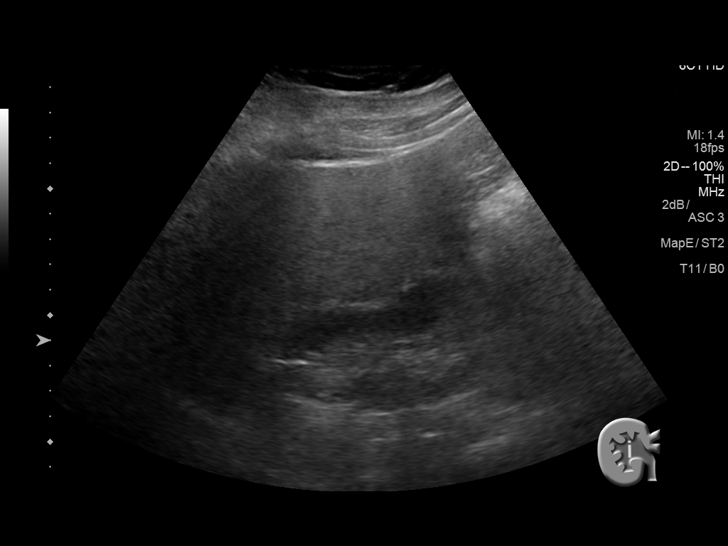
[im 36/62]
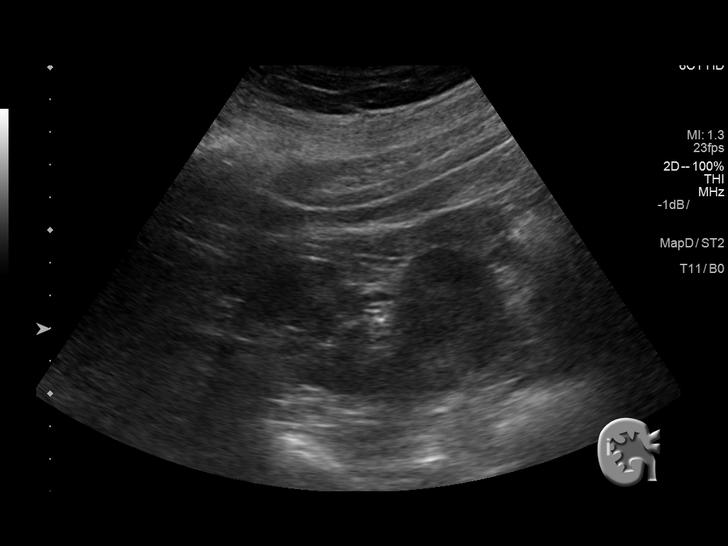
[im 41/62]
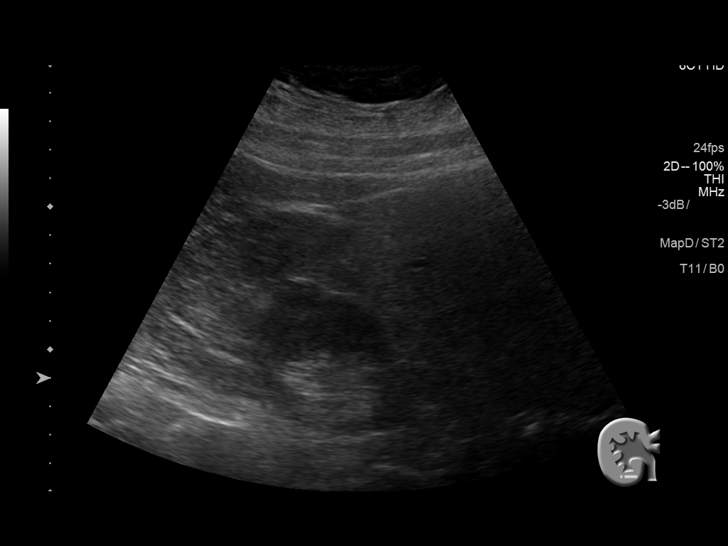
[im 46/62]
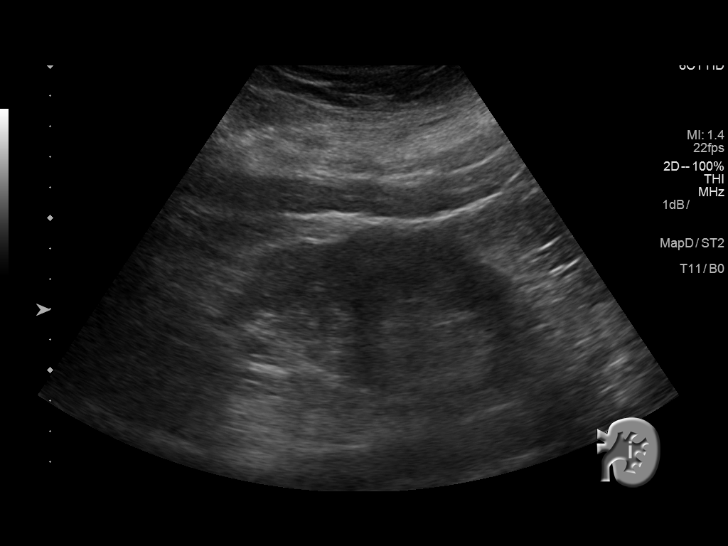
[im 51/62]
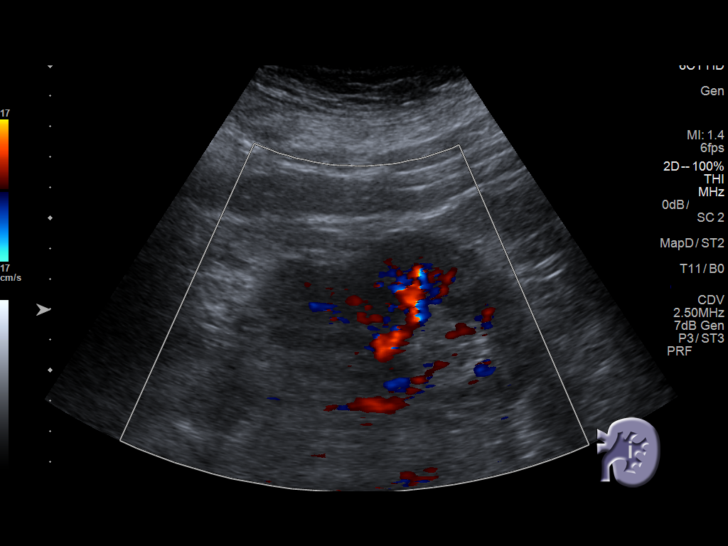
[im 56/62]
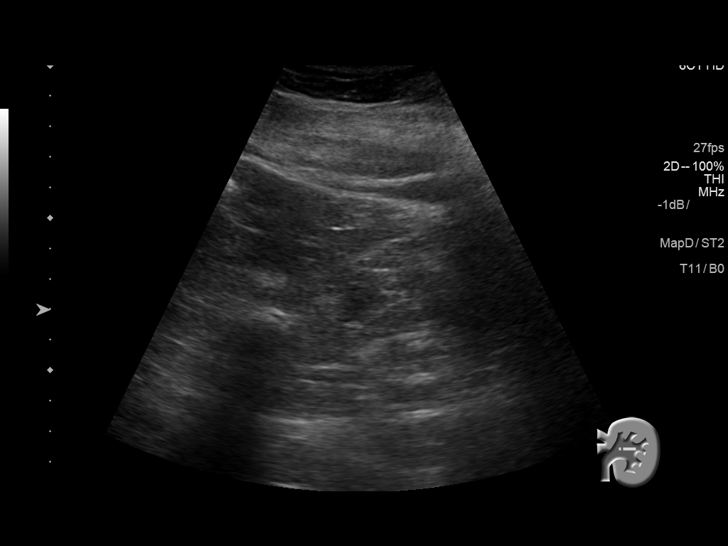
[im 62/62]
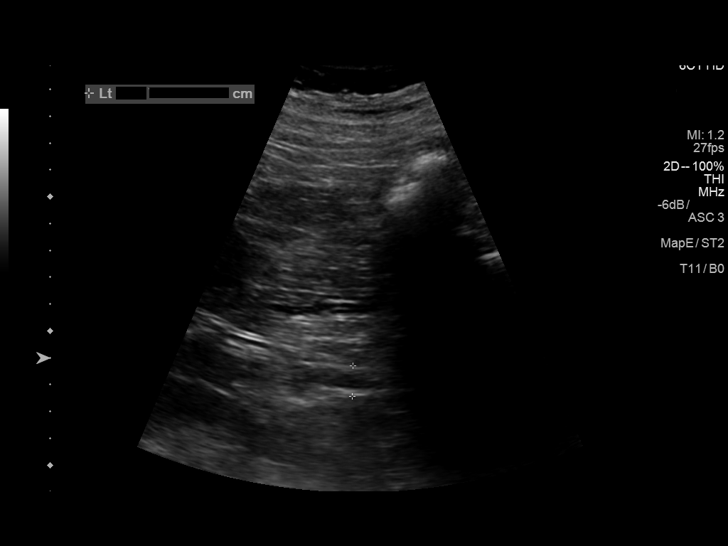

[13 of 25 positions shown; findings below may reference images not displayed]

FINDINGS: Abdominal Aorta

No aneurysm identified.

Maximum AP

Diameter:  2.8 cm

Maximum TRV

Diameter: 2.9 cm

Right Common Iliac Artery

Maximal right common iliac artery diameter is 1.3 cm.

Left Common Iliac Artery

Maximal left common iliac artery diameter is 1.1 cm.

IVC

No abnormality visualized.

Right Kidney

Length: 9.3 cm in length. Cortical echogenicity is within normal
limits. No mass or hydronephrosis. There is moderate cortical
thinning in the upper pole.

Left Kidney

Length: 10.2 cm. Normal cortical echogenicity. No hydronephrosis. No
mass. There is mild cortical thinning in the upper pole.
IMPRESSION: No evidence of aortic aneurysm. It is ectatic with a maximal
diameter of 2.9 cm. Ectatic abdominal aorta at risk for aneurysm
development. Recommend followup by ultrasound in 5 years. This
recommendation follows ACR consensus guidelines: White Paper of the
ACR Incidental Findings Committee II on Vascular Findings. [HOSPITAL] 4067; [DATE].

Chronic changes of the kidneys.

## 2017-01-30 IMAGING — CT CT HEAD W/O CM
1 series · 16 of 30 positions shown, 20 images · non-contrast
Comparison: None.

CLINICAL DATA: Headache for 6 weeks

EXAM:
CT HEAD WITHOUT CONTRAST
TECHNIQUE: Contiguous axial images were obtained from the base of the skull
through the vertex without intravenous contrast.

[Series 2: head wo · axial · 0.47mm/px · z∈[-161,-35]mm · 16 of 32 slices shown, 20 images]
[im 2/32  brain]
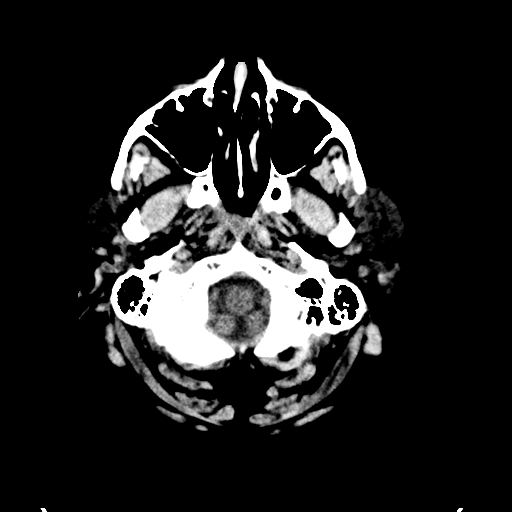
[im 2/32  bone]
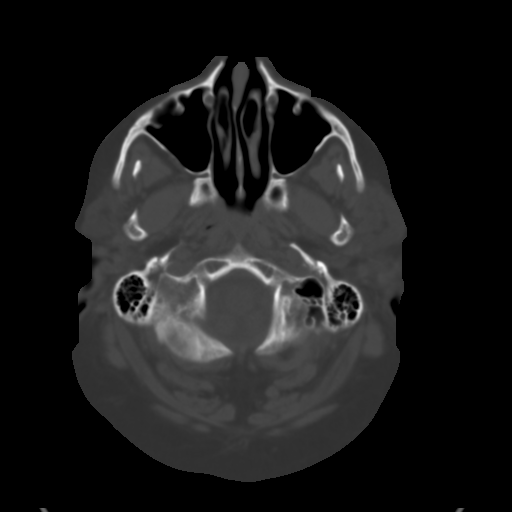
[im 4/32  brain]
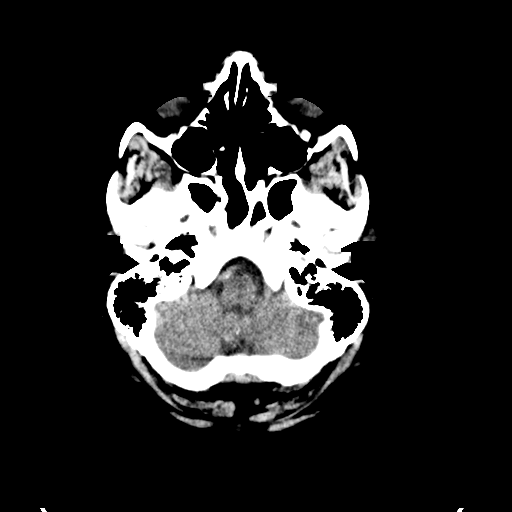
[im 6/32  brain]
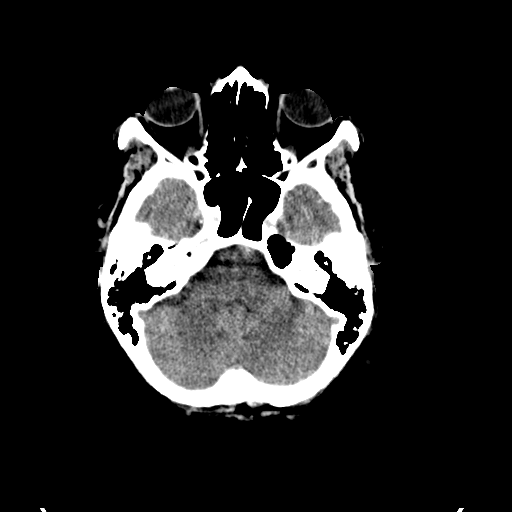
[im 8/32  brain]
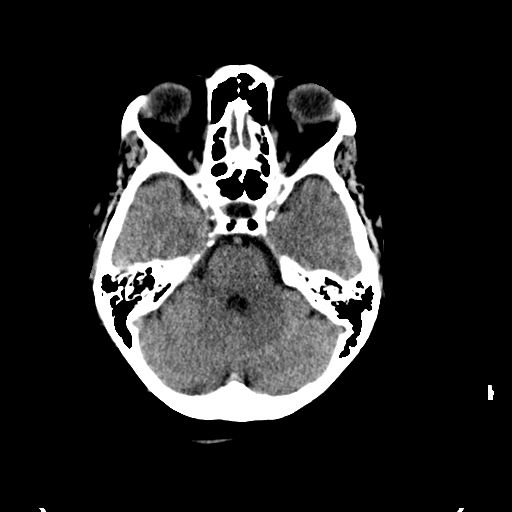
[im 9/32  brain]
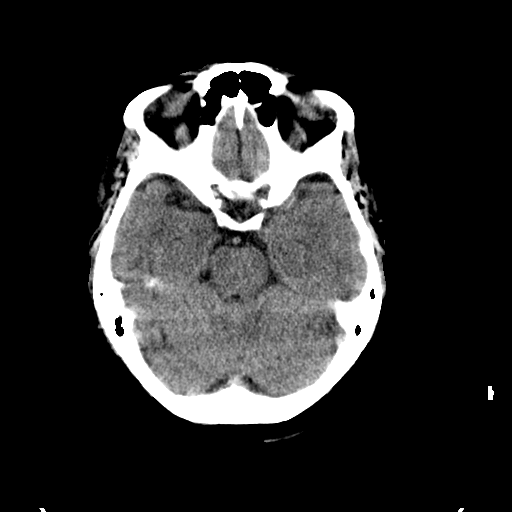
[im 9/32  bone]
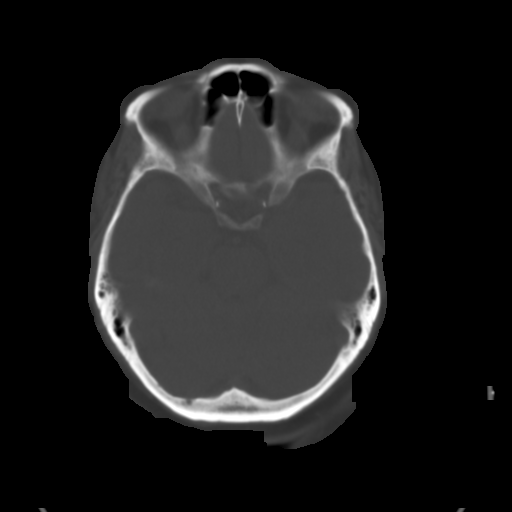
[im 11/32  brain]
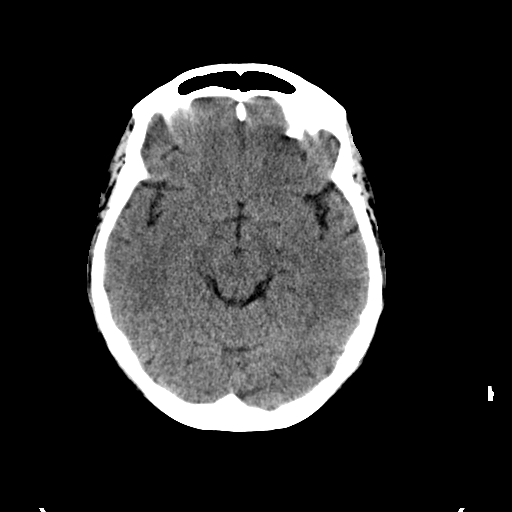
[im 13/32  brain]
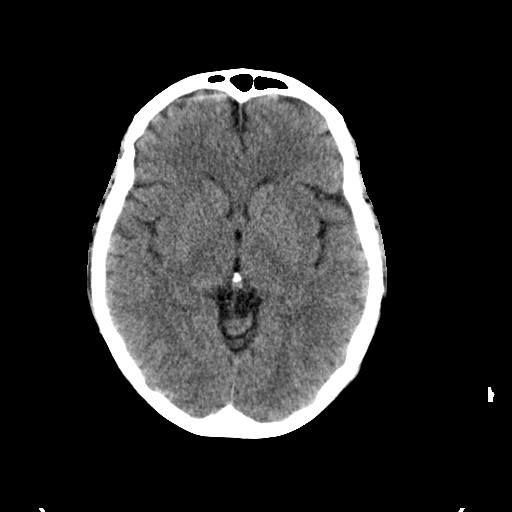
[im 15/32  brain]
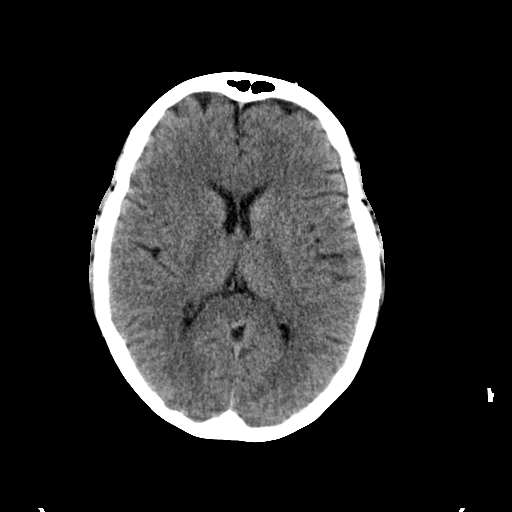
[im 17/32  brain]
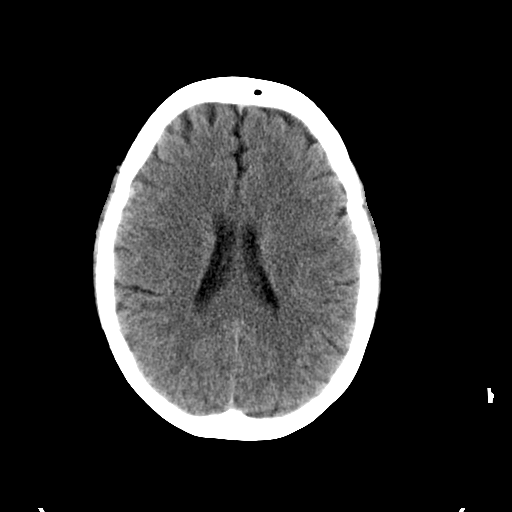
[im 17/32  bone]
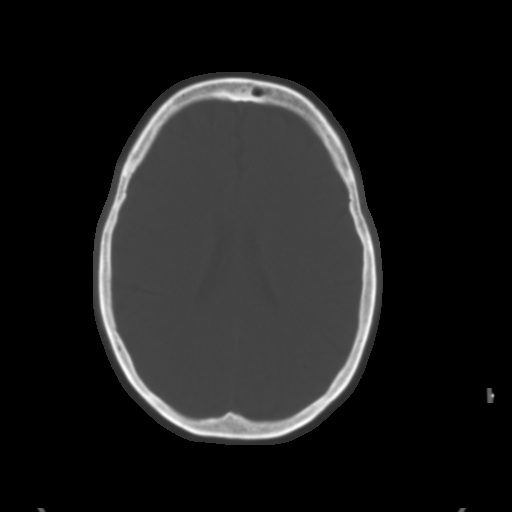
[im 19/32  brain]
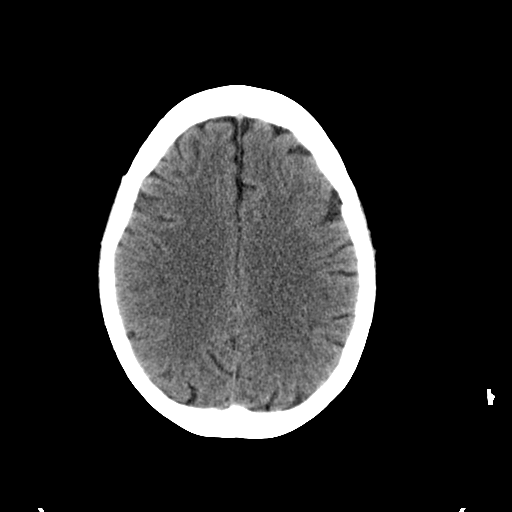
[im 21/32  brain]
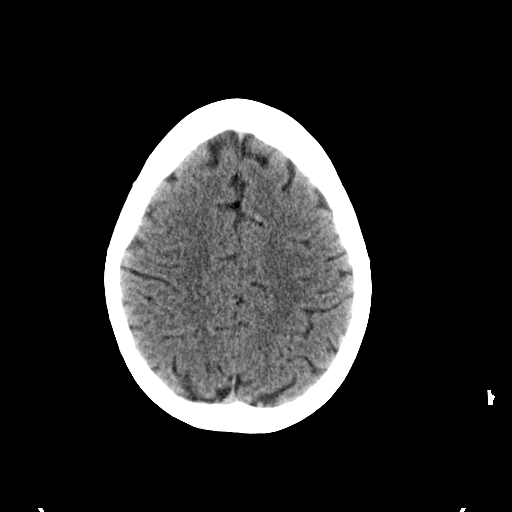
[im 23/32  brain]
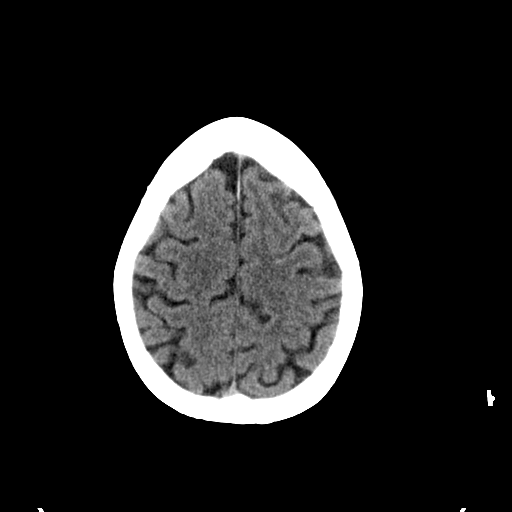
[im 24/32  brain]
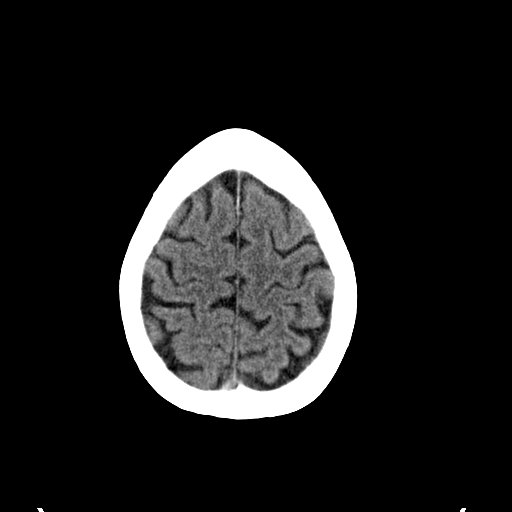
[im 24/32  bone]
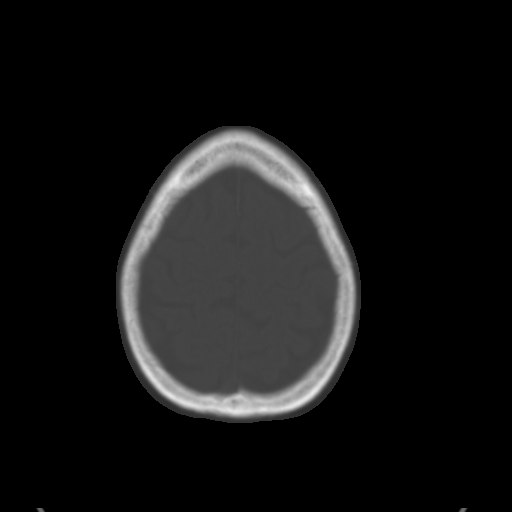
[im 26/32  brain]
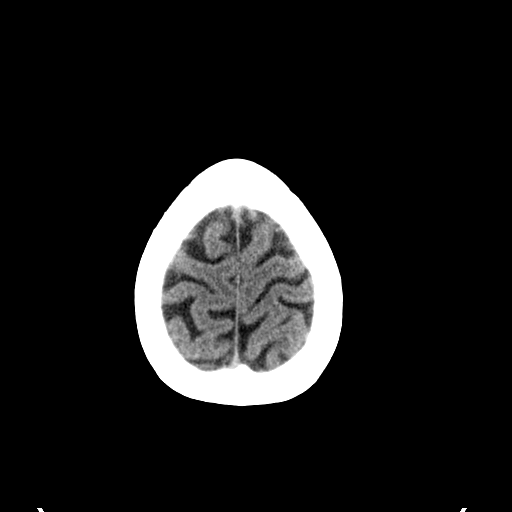
[im 28/32  brain]
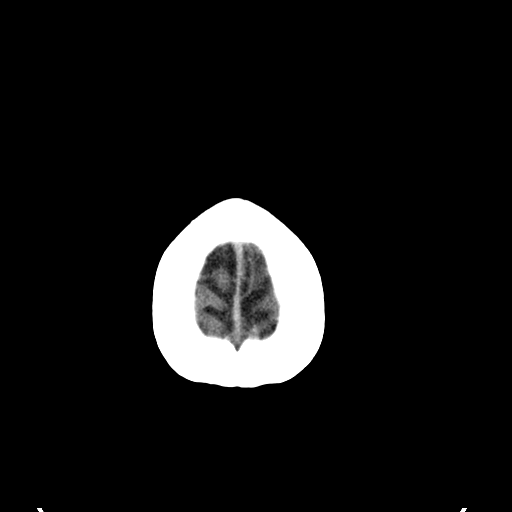
[im 30/32  brain]
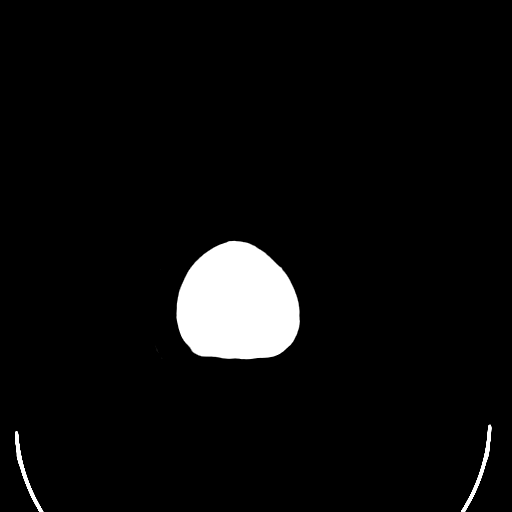

[16 of 30 positions shown; findings below may reference images not displayed]

FINDINGS: The ventricles are normal in size and configuration. There is no
intracranial mass, hemorrhage, extra-axial fluid collection, or
midline shift. Gray-white compartments are normal. No acute infarct
evident. The bony calvarium appears intact. The mastoid air cells
are clear. No intraorbital lesions are identified.
IMPRESSION: Study within normal limits.

## 2019-07-11 DEATH — deceased
